# Patient Record
Sex: Female | Born: 1945
Health system: Southern US, Community
[De-identification: ages and names within clinical notes are randomized; demographics above are authoritative.]

## PROBLEM LIST (undated history)

## (undated) DIAGNOSIS — Z8489 Family history of other specified conditions: Secondary | ICD-10-CM

## (undated) DIAGNOSIS — R921 Mammographic calcification found on diagnostic imaging of breast: Secondary | ICD-10-CM

## (undated) DIAGNOSIS — M1990 Unspecified osteoarthritis, unspecified site: Secondary | ICD-10-CM

## (undated) DIAGNOSIS — R897 Abnormal histological findings in specimens from other organs, systems and tissues: Secondary | ICD-10-CM

## (undated) DIAGNOSIS — Z9889 Other specified postprocedural states: Secondary | ICD-10-CM

## (undated) DIAGNOSIS — H269 Unspecified cataract: Secondary | ICD-10-CM

## (undated) DIAGNOSIS — I10 Essential (primary) hypertension: Secondary | ICD-10-CM

## (undated) HISTORY — PX: CATARACT EXTRACTION: SUR2

## (undated) HISTORY — DX: Unspecified cataract: H26.9

## (undated) HISTORY — DX: Abnormal histological findings in specimens from other organs, systems and tissues: R89.7

## (undated) HISTORY — PX: BREAST CYST ASPIRATION: SHX578

## (undated) HISTORY — DX: Essential (primary) hypertension: I10

## (undated) HISTORY — DX: Unspecified osteoarthritis, unspecified site: M19.90

## (undated) HISTORY — PX: BREAST BIOPSY: SHX20

---

## 2006-10-07 DIAGNOSIS — Z9889 Other specified postprocedural states: Secondary | ICD-10-CM

## 2006-10-07 HISTORY — PX: COLONOSCOPY: SHX174

## 2006-10-07 HISTORY — DX: Other specified postprocedural states: Z98.890

## 2006-10-07 LAB — HM COLONOSCOPY

## 2012-10-07 HISTORY — PX: BREAST BIOPSY: SHX20

## 2013-10-04 DIAGNOSIS — R897 Abnormal histological findings in specimens from other organs, systems and tissues: Secondary | ICD-10-CM

## 2013-10-04 HISTORY — DX: Abnormal histological findings in specimens from other organs, systems and tissues: R89.7

## 2014-10-07 DIAGNOSIS — Z8489 Family history of other specified conditions: Secondary | ICD-10-CM

## 2014-10-07 HISTORY — DX: Family history of other specified conditions: Z84.89

## 2015-10-08 LAB — COLOGUARD: COLOGUARD: NEGATIVE

## 2015-11-28 DIAGNOSIS — R5381 Other malaise: Secondary | ICD-10-CM | POA: Diagnosis not present

## 2015-11-28 DIAGNOSIS — I10 Essential (primary) hypertension: Secondary | ICD-10-CM | POA: Diagnosis not present

## 2015-11-28 DIAGNOSIS — R7301 Impaired fasting glucose: Secondary | ICD-10-CM | POA: Diagnosis not present

## 2015-12-13 DIAGNOSIS — H40013 Open angle with borderline findings, low risk, bilateral: Secondary | ICD-10-CM | POA: Diagnosis not present

## 2015-12-18 DIAGNOSIS — H40013 Open angle with borderline findings, low risk, bilateral: Secondary | ICD-10-CM | POA: Diagnosis not present

## 2016-01-01 DIAGNOSIS — R7301 Impaired fasting glucose: Secondary | ICD-10-CM | POA: Diagnosis not present

## 2016-01-01 DIAGNOSIS — E7800 Pure hypercholesterolemia, unspecified: Secondary | ICD-10-CM | POA: Diagnosis not present

## 2016-01-01 DIAGNOSIS — E349 Endocrine disorder, unspecified: Secondary | ICD-10-CM | POA: Diagnosis not present

## 2016-01-01 DIAGNOSIS — I10 Essential (primary) hypertension: Secondary | ICD-10-CM | POA: Diagnosis not present

## 2016-01-03 DIAGNOSIS — H353131 Nonexudative age-related macular degeneration, bilateral, early dry stage: Secondary | ICD-10-CM | POA: Diagnosis not present

## 2016-01-03 DIAGNOSIS — H2513 Age-related nuclear cataract, bilateral: Secondary | ICD-10-CM | POA: Diagnosis not present

## 2016-01-03 DIAGNOSIS — H35372 Puckering of macula, left eye: Secondary | ICD-10-CM | POA: Diagnosis not present

## 2016-01-03 DIAGNOSIS — H40013 Open angle with borderline findings, low risk, bilateral: Secondary | ICD-10-CM | POA: Diagnosis not present

## 2016-01-19 DIAGNOSIS — H2511 Age-related nuclear cataract, right eye: Secondary | ICD-10-CM | POA: Diagnosis not present

## 2016-01-29 DIAGNOSIS — H2512 Age-related nuclear cataract, left eye: Secondary | ICD-10-CM | POA: Diagnosis not present

## 2016-02-19 DIAGNOSIS — H2512 Age-related nuclear cataract, left eye: Secondary | ICD-10-CM | POA: Diagnosis not present

## 2016-02-19 DIAGNOSIS — Z01818 Encounter for other preprocedural examination: Secondary | ICD-10-CM | POA: Diagnosis not present

## 2016-04-12 DIAGNOSIS — Z1231 Encounter for screening mammogram for malignant neoplasm of breast: Secondary | ICD-10-CM | POA: Diagnosis not present

## 2016-04-12 LAB — HM MAMMOGRAPHY

## 2016-04-15 DIAGNOSIS — Z961 Presence of intraocular lens: Secondary | ICD-10-CM | POA: Diagnosis not present

## 2016-05-30 DIAGNOSIS — E7800 Pure hypercholesterolemia, unspecified: Secondary | ICD-10-CM | POA: Diagnosis not present

## 2016-05-30 DIAGNOSIS — R7301 Impaired fasting glucose: Secondary | ICD-10-CM | POA: Diagnosis not present

## 2016-05-30 DIAGNOSIS — Z1159 Encounter for screening for other viral diseases: Secondary | ICD-10-CM | POA: Diagnosis not present

## 2016-06-13 DIAGNOSIS — Z0000 Encounter for general adult medical examination without abnormal findings: Secondary | ICD-10-CM | POA: Diagnosis not present

## 2016-06-13 DIAGNOSIS — I10 Essential (primary) hypertension: Secondary | ICD-10-CM | POA: Diagnosis not present

## 2016-06-13 DIAGNOSIS — R7303 Prediabetes: Secondary | ICD-10-CM | POA: Diagnosis not present

## 2016-06-13 DIAGNOSIS — E7800 Pure hypercholesterolemia, unspecified: Secondary | ICD-10-CM | POA: Diagnosis not present

## 2016-07-10 DIAGNOSIS — Z1212 Encounter for screening for malignant neoplasm of rectum: Secondary | ICD-10-CM | POA: Diagnosis not present

## 2016-07-10 DIAGNOSIS — Z1211 Encounter for screening for malignant neoplasm of colon: Secondary | ICD-10-CM | POA: Diagnosis not present

## 2016-08-26 DIAGNOSIS — H40013 Open angle with borderline findings, low risk, bilateral: Secondary | ICD-10-CM | POA: Diagnosis not present

## 2016-09-16 DIAGNOSIS — H26493 Other secondary cataract, bilateral: Secondary | ICD-10-CM | POA: Diagnosis not present

## 2016-09-16 DIAGNOSIS — H40023 Open angle with borderline findings, high risk, bilateral: Secondary | ICD-10-CM | POA: Diagnosis not present

## 2016-09-16 DIAGNOSIS — Z961 Presence of intraocular lens: Secondary | ICD-10-CM | POA: Diagnosis not present

## 2016-10-15 DIAGNOSIS — H26492 Other secondary cataract, left eye: Secondary | ICD-10-CM | POA: Diagnosis not present

## 2016-11-05 DIAGNOSIS — Z961 Presence of intraocular lens: Secondary | ICD-10-CM | POA: Diagnosis not present

## 2016-11-27 DIAGNOSIS — D481 Neoplasm of uncertain behavior of connective and other soft tissue: Secondary | ICD-10-CM | POA: Diagnosis not present

## 2016-11-27 DIAGNOSIS — R52 Pain, unspecified: Secondary | ICD-10-CM | POA: Diagnosis not present

## 2016-12-02 DIAGNOSIS — E7800 Pure hypercholesterolemia, unspecified: Secondary | ICD-10-CM | POA: Diagnosis not present

## 2016-12-02 DIAGNOSIS — R5381 Other malaise: Secondary | ICD-10-CM | POA: Diagnosis not present

## 2016-12-02 DIAGNOSIS — R7303 Prediabetes: Secondary | ICD-10-CM | POA: Diagnosis not present

## 2016-12-02 DIAGNOSIS — I10 Essential (primary) hypertension: Secondary | ICD-10-CM | POA: Diagnosis not present

## 2016-12-05 HISTORY — PX: EYE SURGERY: SHX253

## 2016-12-12 DIAGNOSIS — E7800 Pure hypercholesterolemia, unspecified: Secondary | ICD-10-CM | POA: Diagnosis not present

## 2016-12-12 DIAGNOSIS — I10 Essential (primary) hypertension: Secondary | ICD-10-CM | POA: Diagnosis not present

## 2016-12-12 DIAGNOSIS — R7303 Prediabetes: Secondary | ICD-10-CM | POA: Diagnosis not present

## 2017-03-05 DIAGNOSIS — H40023 Open angle with borderline findings, high risk, bilateral: Secondary | ICD-10-CM | POA: Diagnosis not present

## 2017-03-11 DIAGNOSIS — H40023 Open angle with borderline findings, high risk, bilateral: Secondary | ICD-10-CM | POA: Diagnosis not present

## 2017-05-27 ENCOUNTER — Encounter (INDEPENDENT_AMBULATORY_CARE_PROVIDER_SITE_OTHER): Payer: Self-pay

## 2017-05-27 ENCOUNTER — Encounter: Payer: Self-pay | Admitting: Primary Care

## 2017-05-27 ENCOUNTER — Ambulatory Visit: Payer: Self-pay | Admitting: Primary Care

## 2017-05-27 ENCOUNTER — Ambulatory Visit (INDEPENDENT_AMBULATORY_CARE_PROVIDER_SITE_OTHER): Payer: Medicare Other | Admitting: Primary Care

## 2017-05-27 VITALS — BP 136/86 | HR 78 | Temp 97.7°F | Ht 63.25 in | Wt 171.1 lb

## 2017-05-27 DIAGNOSIS — I10 Essential (primary) hypertension: Secondary | ICD-10-CM | POA: Diagnosis not present

## 2017-05-27 DIAGNOSIS — Z1239 Encounter for other screening for malignant neoplasm of breast: Secondary | ICD-10-CM

## 2017-05-27 DIAGNOSIS — R2231 Localized swelling, mass and lump, right upper limb: Secondary | ICD-10-CM | POA: Insufficient documentation

## 2017-05-27 DIAGNOSIS — Z1231 Encounter for screening mammogram for malignant neoplasm of breast: Secondary | ICD-10-CM | POA: Diagnosis not present

## 2017-05-27 DIAGNOSIS — H409 Unspecified glaucoma: Secondary | ICD-10-CM | POA: Diagnosis not present

## 2017-05-27 HISTORY — DX: Localized swelling, mass and lump, right upper limb: R22.31

## 2017-05-27 NOTE — Progress Notes (Signed)
   Subjective:    Patient ID: Kim Holloway, female    DOB: [birth year], 71 y.o.   MRN: [id number]  HPI  Kim Holloway is a 71 year old female who presents today to establish care and discuss the problems mentioned below. Will obtain old records. She is due for her mammogram. Her last CPE was in September 2017.  1) Essential Hypertension: Currently managed on losartan-HCTZ 100/12.5 mg. She denies chest pain, shortness of breath. Some dizziness with abrupt positional changes.   BP Readings from Last 3 Encounters:  05/27/17 136/86     2) Finger Mass: Located right second digit, medial to DIP joint that has been present for the past 2-3 months. Over the past month she's noticed more discomfort and decreased sensation. She had a benign cyst removed to the same finger which was in the middle of the DIP joint to anterior side. This was removed in February 2018. She is requesting evaluation for removal.  3) Glaucoma: Left side. Suspicion for glaucoma that was originally diagnosed 3-4 years ago. Just moved from [place] and is needing ophthalmologist.   Review of Systems  Respiratory: Negative for shortness of breath.   Cardiovascular: Negative for chest pain.  Skin: Negative for color change and wound.       Finger mass  Neurological: Negative for dizziness and headaches.       Past Medical History:  Diagnosis Date  . Abnormal breast biopsy 10/04/2013   right breast  . Hypertension      Social History   Social History  . Marital status: Married    Spouse name: N/A  . Number of children: N/A  . Years of education: N/A   Occupational History  . Not on file.   Social History Main Topics  . Smoking status: Never Smoker  . Smokeless tobacco: Never Used  . Alcohol use No  . Drug use: Unknown  . Sexual activity: Not on file   Other Topics Concern  . Not on file   Social History Narrative   Married.   Moved from [place]. Originally from [place].   Retired.          Past Surgical  History:  Procedure Laterality Date  . CATARACT EXTRACTION  01/19/2016, 02/19/2016    No family history on file.  No Known Allergies  No current outpatient prescriptions on file prior to visit.   No current facility-administered medications on file prior to visit.     BP 136/86   Pulse 78   Temp 97.7 F (36.5 C) (Oral)   Ht 5' 3.25" (1.607 m)   Wt 171 lb 1.9 oz (77.6 kg)   SpO2 99%   BMI 30.07 kg/m    Objective:   Physical Exam  Constitutional: She appears well-nourished.  Neck: Neck supple.  Cardiovascular: Normal rate and regular rhythm.   Pulmonary/Chest: Effort normal and breath sounds normal.  Skin: Skin is warm and dry.  0.5 cm circular, flesh colored, firm, immobile mass to right second digit lateral to DIP joint. Non tender.  Psychiatric: She has a normal mood and affect.          Assessment & Plan:

## 2017-05-27 NOTE — Assessment & Plan Note (Signed)
Diagnosed 2-3 years ago. Referral placed to opthalmology for establishment and mangement.

## 2017-05-27 NOTE — Patient Instructions (Signed)
You will be contacted regarding your referral to the hand surgeon and also to the ophthalmologist.  Please let us know if you have not heard back within one week.   Call the [facility] to schedule your mammogram.   Please schedule a physical with me in the next 1-2 months. You may also schedule a lab only appointment 3-4 days prior. We will discuss your lab results in detail during your physical.  It was a pleasure to meet you today! Please don't hesitate to call me with any questions. Welcome to [facility]!

## 2017-05-27 NOTE — Assessment & Plan Note (Signed)
History of benign cyst removed in February 2018, looks like this has returned. Exam today represents benign cyst. She would like to have this surgically removed given site and discomfort. Referral placed for consultation.

## 2017-05-27 NOTE — Assessment & Plan Note (Signed)
Stable on losartan-HCTZ, continue same.

## 2017-06-03 ENCOUNTER — Ambulatory Visit
Admission: RE | Admit: 2017-06-03 | Discharge: 2017-06-03 | Disposition: A | Discharge status: Home / Self Care | Payer: Medicare Other | Source: Ambulatory Visit | Attending: Primary Care | Admitting: Primary Care

## 2017-06-03 DIAGNOSIS — Z1239 Encounter for other screening for malignant neoplasm of breast: Secondary | ICD-10-CM

## 2017-06-03 DIAGNOSIS — Z1231 Encounter for screening mammogram for malignant neoplasm of breast: Secondary | ICD-10-CM | POA: Insufficient documentation

## 2017-06-04 ENCOUNTER — Encounter: Payer: Self-pay | Admitting: Primary Care

## 2017-06-07 DIAGNOSIS — R921 Mammographic calcification found on diagnostic imaging of breast: Secondary | ICD-10-CM

## 2017-06-07 HISTORY — DX: Mammographic calcification found on diagnostic imaging of breast: R92.1

## 2017-06-18 ENCOUNTER — Encounter: Payer: Self-pay | Admitting: Primary Care

## 2017-06-19 ENCOUNTER — Inpatient Hospital Stay
Admission: RE | Admit: 2017-06-19 | Discharge: 2017-06-19 | Disposition: A | Discharge status: Home / Self Care | Payer: Self-pay | Source: Ambulatory Visit | Attending: *Deleted | Admitting: *Deleted

## 2017-06-19 ENCOUNTER — Other Ambulatory Visit: Payer: Self-pay | Admitting: *Deleted

## 2017-06-19 DIAGNOSIS — Z9289 Personal history of other medical treatment: Secondary | ICD-10-CM

## 2017-06-20 ENCOUNTER — Ambulatory Visit: Payer: Medicare Other

## 2017-06-20 ENCOUNTER — Other Ambulatory Visit: Payer: Self-pay | Admitting: Primary Care

## 2017-06-20 DIAGNOSIS — R928 Other abnormal and inconclusive findings on diagnostic imaging of breast: Secondary | ICD-10-CM

## 2017-06-23 ENCOUNTER — Ambulatory Visit (INDEPENDENT_AMBULATORY_CARE_PROVIDER_SITE_OTHER): Payer: Medicare Other

## 2017-06-23 ENCOUNTER — Other Ambulatory Visit: Payer: Self-pay

## 2017-06-23 VITALS — BP 130/80 | HR 75 | Temp 98.0°F | Ht 64.5 in | Wt 171.0 lb

## 2017-06-23 DIAGNOSIS — Z0000 Encounter for general adult medical examination without abnormal findings: Secondary | ICD-10-CM | POA: Diagnosis not present

## 2017-06-23 DIAGNOSIS — Z1159 Encounter for screening for other viral diseases: Secondary | ICD-10-CM | POA: Diagnosis not present

## 2017-06-23 DIAGNOSIS — I10 Essential (primary) hypertension: Secondary | ICD-10-CM

## 2017-06-23 MED ORDER — LOSARTAN POTASSIUM-HCTZ 100-12.5 MG PO TABS: 1.0000 | ORAL_TABLET | Freq: Every day | ORAL | 0 refills | Status: DC

## 2017-06-23 NOTE — Progress Notes (Signed)
Pre visit review using our clinic review tool, if applicable. No additional management support is needed unless otherwise documented below in the visit note. 

## 2017-06-23 NOTE — Progress Notes (Signed)
PCP notes:   Health maintenance:  Flu vaccine - patient declined Hep C screening - completed  Abnormal screenings:   Hearing - failed  Hearing Screening   125Hz  250Hz  500Hz  1000Hz  2000Hz  3000Hz  4000Hz  6000Hz  8000Hz   Right ear:   0 0 40  0    Left ear:   40 40 40  40     Depression score: 1  Patient concerns:   None  Nurse concerns:  None  Next PCP appt:   06/25/17 @ 1145

## 2017-06-23 NOTE — Progress Notes (Signed)
Subjective:   Kim Holloway is a 71 y.o. female who presents for Medicare Annual preventive examination.  Review of Systems:  N/A Cardiac Risk Factors include: advanced age (>49men, >69 women);hypertension     Objective:     Vitals: BP 130/80 (BP Location: Right Arm, Patient Position: Sitting, Cuff Size: Normal)   Pulse 75   Temp 98 F (36.7 C) (Oral)   Ht 5' 4.5" (1.638 m) Comment: no shoes  Wt 171 lb (77.6 kg)   SpO2 95%   BMI 28.90 kg/m   Body mass index is 28.9 kg/m.   Tobacco History  Smoking Status  . Never Smoker  Smokeless Tobacco  . Never Used     Counseling given: No   Past Medical History:  Diagnosis Date  . Abnormal breast biopsy 10/04/2013   right breast  . Hypertension    Past Surgical History:  Procedure Laterality Date  . BREAST BIOPSY Bilateral 2014   stereo bx neg   . CATARACT EXTRACTION  01/19/2016, 02/19/2016  . EYE SURGERY Left 12/2016   laser surgery   History reviewed. No pertinent family history. History  Sexual Activity  . Sexual activity: Not on file    Outpatient Encounter Prescriptions as of 06/23/2017  Medication Sig  . aspirin EC 81 MG tablet Take 81 mg by mouth daily.  Marland Kitchen CALCIUM PO Take 2 tablets by mouth daily.  . cholecalciferol (VITAMIN D) 1000 units tablet Take 1,000 Units by mouth daily.  Marland Kitchen losartan-hydrochlorothiazide (HYZAAR) 100-12.5 MG tablet Take 1 tablet by mouth daily.    No facility-administered encounter medications on file as of 06/23/2017.     Activities of Daily Living In your present state of health, do you have any difficulty performing the following activities: 06/23/2017  Hearing? N  Vision? N  Difficulty concentrating or making decisions? N  Walking or climbing stairs? N  Dressing or bathing? N  Doing errands, shopping? N  Preparing Food and eating ? N  Using the Toilet? N  In the past six months, have you accidently leaked urine? N  Do you have problems with loss of bowel control? N    Managing your Medications? N  Managing your Finances? N  Housekeeping or managing your Housekeeping? N    Patient Care Team: Pleas Koch, NP as PCP - General (Internal Medicine)    Assessment:     Hearing Screening   125Hz  250Hz  500Hz  1000Hz  2000Hz  3000Hz  4000Hz  6000Hz  8000Hz   Right ear:   0 0 40  0    Left ear:   40 40 40  40    Vision Screening Comments: Last vision exam in March 2018   Exercise Activities and Dietary recommendations Current Exercise Habits: Home exercise routine, Type of exercise: walking, Time (Minutes): 60, Frequency (Times/Week): 7, Weekly Exercise (Minutes/Week): 420, Intensity: Mild, Exercise limited by: None identified  Goals    . Increase physical activity          Starting 06/23/2017 and as weather permits, I will continue to walk at least 60 min dialy      Fall Risk Fall Risk  06/23/2017  Falls in the past year? No   Depression Screen PHQ 2/9 Scores 06/23/2017  PHQ - 2 Score 0  PHQ- 9 Score 1     Cognitive Function MMSE - Mini Mental State Exam 06/23/2017  Orientation to time 5  Orientation to Place 5  Registration 3  Attention/ Calculation 0  Recall 3  Language- name 2 objects 0  Language- repeat 1  Language- follow 3 step command 3  Language- read & follow direction 0  Write a sentence 0  Copy design 0  Total score 20       PLEASE NOTE: A Mini-Cog screen was completed. Maximum score is 20. A value of 0 denotes this part of Folstein MMSE was not completed or the patient failed this part of the Mini-Cog screening.   Mini-Cog Screening Orientation to Time - Max 5 pts Orientation to Place - Max 5 pts Registration - Max 3 pts Recall - Max 3 pts Language Repeat - Max 1 pts Language Follow 3 Step Command - Max 3 pts   Immunization History  Administered Date(s) Administered  . Pneumococcal Conjugate-13 11/30/2014  . Pneumococcal Polysaccharide-23 07/07/2013  . Tdap 07/07/2013  . Zoster 01/01/2016   Screening  Tests Health Maintenance  Topic Date Due  . INFLUENZA VACCINE  01/04/2018 (Originally 05/07/2017)  . MAMMOGRAM  06/04/2019  . Fecal DNA (Cologuard)  07/08/2019  . TETANUS/TDAP  07/08/2023  . DEXA SCAN  Completed  . Hepatitis C Screening  Completed  . PNA vac Low Risk Adult  Completed      Plan:     I have personally reviewed and addressed the Medicare Annual Wellness questionnaire and have noted the following in the patient's chart:  A. Medical and social history B. Use of alcohol, tobacco or illicit drugs  C. Current medications and supplements D. Functional ability and status E.  Nutritional status F.  Physical activity G. Advance directives H. List of other physicians I.  Hospitalizations, surgeries, and ER visits in previous 12 months J.  [place] to include hearing, vision, cognitive, depression L. Referrals and appointments - none  In addition, I have reviewed and discussed with patient certain preventive protocols, quality metrics, and best practice recommendations. A written personalized care plan for preventive services as well as general preventive health recommendations were provided to patient.  See attached scanned questionnaire for additional information.   Signed,   Lindell Noe, MHA, BS, LPN Health Coach

## 2017-06-23 NOTE — Patient Instructions (Signed)
Ms. Toothman , Thank you for taking time to come for your Medicare Wellness Visit. I appreciate your ongoing commitment to your health goals. Please review the following plan we discussed and let me know if I can assist you in the future.   These are the goals we discussed: Goals    . Increase physical activity          Starting 06/23/2017 and as weather permits, I will continue to walk at least 60 min dialy       This is a list of the screening recommended for you and due dates:  Health Maintenance  Topic Date Due  . Flu Shot  01/04/2018*  . Mammogram  06/04/2019  . Cologuard (Stool DNA test)  07/08/2019  . Tetanus Vaccine  07/08/2023  . DEXA scan (bone density measurement)  Completed  .  Hepatitis C: One time screening is recommended by Center for Disease Control  (CDC) for  adults born from [birth year] through 1965.   Completed  . Pneumonia vaccines  Completed  *Topic was postponed. The date shown is not the original due date.   Preventive Care for Adults  A healthy lifestyle and preventive care can promote health and wellness. Preventive health guidelines for adults include the following key practices.  . A routine yearly physical is a good way to check with your health care provider about your health and preventive screening. It is a chance to share any concerns and updates on your health and to receive a thorough exam.  . Visit your dentist for a routine exam and preventive care every 6 months. Brush your teeth twice a day and floss once a day. Good oral hygiene prevents tooth decay and gum disease.  . The frequency of eye exams is based on your age, health, family medical history, use  of contact lenses, and other factors. Follow your health care provider's ecommendations for frequency of eye exams.  . Eat a healthy diet. Foods like vegetables, fruits, whole grains, low-fat dairy products, and lean protein foods contain the nutrients you need without too many calories. Decrease your  intake of foods high in solid fats, added sugars, and salt. Eat the right amount of calories for you. Get information about a proper diet from your health care provider, if necessary.  . Regular physical exercise is one of the most important things you can do for your health. Most adults should get at least 150 minutes of moderate-intensity exercise (any activity that increases your heart rate and causes you to sweat) each week. In addition, most adults need muscle-strengthening exercises on 2 or more days a week.  Silver Sneakers may be a benefit available to you. To determine eligibility, you may visit the website: www.silversneakers.com or contact program at 651-650-5449 Mon-Fri between 8AM-8PM.   . Maintain a healthy weight. The body mass index (BMI) is a screening tool to identify possible weight problems. It provides an estimate of body fat based on height and weight. Your health care provider can find your BMI and can help you achieve or maintain a healthy weight.   For adults 20 years and older: ? A BMI below 18.5 is considered underweight. ? A BMI of 18.5 to 24.9 is normal. ? A BMI of 25 to 29.9 is considered overweight. ? A BMI of 30 and above is considered obese.   . Maintain normal blood lipids and cholesterol levels by exercising and minimizing your intake of saturated fat. Eat a balanced diet with plenty of  fruit and vegetables. Blood tests for lipids and cholesterol should begin at age 2 and be repeated every 5 years. If your lipid or cholesterol levels are high, you are over 50, or you are at high risk for heart disease, you may need your cholesterol levels checked more frequently. Ongoing high lipid and cholesterol levels should be treated with medicines if diet and exercise are not working.  . If you smoke, find out from your health care provider how to quit. If you do not use tobacco, please do not start.  . If you choose to drink alcohol, please do not consume more than 2  drinks per day. One drink is considered to be 12 ounces (355 mL) of beer, 5 ounces (148 mL) of wine, or 1.5 ounces (44 mL) of liquor.  . If you are 61-66 years old, ask your health care provider if you should take aspirin to prevent strokes.  . Use sunscreen. Apply sunscreen liberally and repeatedly throughout the day. You should seek shade when your shadow is shorter than you. Protect yourself by wearing long sleeves, pants, a wide-brimmed hat, and sunglasses year round, whenever you are outdoors.  . Once a month, do a whole body skin exam, using a mirror to look at the skin on your back. Tell your health care provider of new moles, moles that have irregular borders, moles that are larger than a pencil eraser, or moles that have changed in shape or color.

## 2017-06-23 NOTE — Progress Notes (Signed)
I reviewed health advisor's note, was available for consultation, and agree with documentation and plan.  

## 2017-06-23 NOTE — Telephone Encounter (Signed)
Noted.  Refill sent to pharmacy. 

## 2017-06-23 NOTE — Telephone Encounter (Signed)
During encounter at [facility], patient requested refill of Losartan-HCTZ 100-12.5 mg. Pharmacy confirmed and is correct as listed.

## 2017-06-24 LAB — HEPATITIS C ANTIBODY
HEP C AB: NONREACTIVE
SIGNAL TO CUT-OFF: 0.01 (ref <1.00)

## 2017-06-25 ENCOUNTER — Encounter: Payer: Self-pay | Admitting: Primary Care

## 2017-06-25 ENCOUNTER — Ambulatory Visit (INDEPENDENT_AMBULATORY_CARE_PROVIDER_SITE_OTHER): Payer: Medicare Other | Admitting: Primary Care

## 2017-06-25 VITALS — BP 136/84 | HR 78 | Temp 97.7°F | Ht 64.5 in | Wt 171.0 lb

## 2017-06-25 DIAGNOSIS — E2839 Other primary ovarian failure: Secondary | ICD-10-CM

## 2017-06-25 DIAGNOSIS — I10 Essential (primary) hypertension: Secondary | ICD-10-CM | POA: Diagnosis not present

## 2017-06-25 DIAGNOSIS — H409 Unspecified glaucoma: Secondary | ICD-10-CM | POA: Diagnosis not present

## 2017-06-25 NOTE — Assessment & Plan Note (Signed)
Stable in the office today, continue losartan-HCTZ 100-12.5 mg. BMP unremarkable and up to date.

## 2017-06-25 NOTE — Assessment & Plan Note (Signed)
Scheduled to see ophthalmologist in December.

## 2017-06-25 NOTE — Patient Instructions (Signed)
Complete your mammogram and bone density testing as scheduled.  Follow up with opthalmology as scheduled.  Follow up with me in 1 year or sooner if needed.  It was a pleasure to see you today!

## 2017-06-25 NOTE — Progress Notes (Signed)
Subjective:    Patient ID: Kim Holloway, female    DOB: 11/28/1945, 71 y.o.   MRN: [id number]  HPI  Kim Holloway is a 71 year old female who presents today for [place] Part 2. She saw our health advisor last week.  Doing well, has no complaints today. Scheduled to see ophthalmologist in December 2018.   BP Readings from Last 3 Encounters:  06/25/17 136/84  06/23/17 130/80  05/27/17 136/86      Immunizations: -Tetanus: Completed in 2014 -Influenza: Declines -Pneumonia: Completed Pneumovax 23 in 2014, and Prevnar 13 in 2016 -Shingles: Insurance would not cover.   Colonoscopy: Completed in 2008, completed cologuard in 2017 - negative Dexa: Completed in 2014, due. Pap Smear: Completed years ago, declines.  Mammogram: Scheduled for 06/30/17   Review of Systems  Constitutional: Negative for unexpected weight change.  HENT: Negative for rhinorrhea.   Respiratory: Negative for cough and shortness of breath.   Cardiovascular: Negative for chest pain.  Gastrointestinal: Negative for constipation and diarrhea.  Genitourinary: Negative for difficulty urinating and menstrual problem.  Musculoskeletal: Negative for arthralgias and myalgias.  Skin: Negative for rash.  Allergic/Immunologic: Negative for environmental allergies.  Neurological: Negative for dizziness, numbness and headaches.  Psychiatric/Behavioral:       Denies concerns for anxiety or depression       Past Medical History:  Diagnosis Date  . Abnormal breast biopsy 10/04/2013   right breast  . Hypertension      Social History   Social History  . Marital status: Married    Spouse name: N/A  . Number of children: N/A  . Years of education: N/A   Occupational History  . Not on file.   Social History Main Topics  . Smoking status: Never Smoker  . Smokeless tobacco: Never Used  . Alcohol use No  . Drug use: No  . Sexual activity: Not on file   Other Topics Concern  . Not on file   Social History Narrative    Married.   Moved from [place]. Originally from [place].   Retired.          Past Surgical History:  Procedure Laterality Date  . BREAST BIOPSY Bilateral 2014   stereo bx neg   . CATARACT EXTRACTION  01/19/2016, 02/19/2016  . EYE SURGERY Left 12/2016   laser surgery    No family history on file.  No Known Allergies  Current Outpatient Prescriptions on File Prior to Visit  Medication Sig Dispense Refill  . aspirin EC 81 MG tablet Take 81 mg by mouth daily.    Marland Kitchen CALCIUM PO Take 2 tablets by mouth daily.    . cholecalciferol (VITAMIN D) 1000 units tablet Take 1,000 Units by mouth daily.    Marland Kitchen losartan-hydrochlorothiazide (HYZAAR) 100-12.5 MG tablet Take 1 tablet by mouth daily. 90 tablet 0   No current facility-administered medications on file prior to visit.     BP 136/84   Pulse 78   Temp 97.7 F (36.5 C) (Oral)   Ht 5' 4.5" (1.638 m)   Wt 171 lb (77.6 kg)   SpO2 95%   BMI 28.90 kg/m    Objective:   Physical Exam  Constitutional: She is oriented to person, place, and time. She appears well-nourished.  HENT:  Right Ear: Tympanic membrane and ear canal normal.  Left Ear: Tympanic membrane and ear canal normal.  Nose: Nose normal.  Mouth/Throat: Oropharynx is clear and moist.  Eyes: Pupils are equal, round, and reactive to light.  Conjunctivae and EOM are normal.  Neck: Neck supple. No thyromegaly present.  Cardiovascular: Normal rate and regular rhythm.   No murmur heard. Pulmonary/Chest: Effort normal and breath sounds normal. She has no rales.  Abdominal: Soft. Bowel sounds are normal. There is no tenderness.  Musculoskeletal: Normal range of motion.  Lymphadenopathy:    She has no cervical adenopathy.  Neurological: She is alert and oriented to person, place, and time. She has normal reflexes. No cranial nerve deficit.  Skin: Skin is warm and dry. No rash noted.  Psychiatric: She has a normal mood and affect.          Assessment & Plan:  Health  Maintenance:  Mammogram pending. Bone density due, ordered today. Declines Pap. Colon cancer screening UTD. Immunizations UTD, declines influenza. Exam unremarkable. Labs unremarkable. Follow up in 1 year.  Sheral Flow, NP

## 2017-06-30 ENCOUNTER — Ambulatory Visit
Admission: RE | Admit: 2017-06-30 | Discharge: 2017-06-30 | Disposition: A | Discharge status: Home / Self Care | Payer: Medicare Other | Source: Ambulatory Visit | Attending: Primary Care | Admitting: Primary Care

## 2017-06-30 DIAGNOSIS — R928 Other abnormal and inconclusive findings on diagnostic imaging of breast: Secondary | ICD-10-CM

## 2017-06-30 DIAGNOSIS — N6001 Solitary cyst of right breast: Secondary | ICD-10-CM | POA: Diagnosis not present

## 2017-06-30 DIAGNOSIS — N6489 Other specified disorders of breast: Secondary | ICD-10-CM | POA: Diagnosis not present

## 2017-07-02 DIAGNOSIS — M25841 Other specified joint disorders, right hand: Secondary | ICD-10-CM | POA: Diagnosis not present

## 2017-07-14 DIAGNOSIS — M25841 Other specified joint disorders, right hand: Secondary | ICD-10-CM | POA: Diagnosis not present

## 2017-07-21 ENCOUNTER — Encounter
Admission: RE | Admit: 2017-07-21 | Discharge: 2017-07-21 | Disposition: A | Discharge status: Home / Self Care | Payer: Medicare Other | Source: Ambulatory Visit | Attending: Orthopedic Surgery | Admitting: Orthopedic Surgery

## 2017-07-21 DIAGNOSIS — Z01810 Encounter for preprocedural cardiovascular examination: Secondary | ICD-10-CM | POA: Insufficient documentation

## 2017-07-21 DIAGNOSIS — Z01812 Encounter for preprocedural laboratory examination: Secondary | ICD-10-CM | POA: Insufficient documentation

## 2017-07-21 DIAGNOSIS — I10 Essential (primary) hypertension: Secondary | ICD-10-CM | POA: Insufficient documentation

## 2017-07-21 HISTORY — DX: Mammographic calcification found on diagnostic imaging of breast: R92.1

## 2017-07-21 HISTORY — DX: Other specified postprocedural states: Z98.890

## 2017-07-21 HISTORY — DX: Family history of other specified conditions: Z84.89

## 2017-07-21 LAB — CBC
HEMATOCRIT: 41 % (ref 35.0–47.0)
Hemoglobin: 14 g/dL (ref 12.0–16.0)
MCH: 31.7 pg (ref 26.0–34.0)
MCHC: 34.2 g/dL (ref 32.0–36.0)
MCV: 92.7 fL (ref 80.0–100.0)
Platelets: 248 10*3/uL (ref 150–440)
RBC: 4.42 MIL/uL (ref 3.80–5.20)
RDW: 12.5 % (ref 11.5–14.5)
WBC: 9.9 10*3/uL (ref 3.6–11.0)

## 2017-07-21 LAB — BASIC METABOLIC PANEL
ANION GAP: 11 (ref 5–15)
BUN: 10 mg/dL (ref 6–20)
CALCIUM: 9.3 mg/dL (ref 8.9–10.3)
CO2: 29 mmol/L (ref 22–32)
CREATININE: 0.59 mg/dL (ref 0.44–1.00)
Chloride: 100 mmol/L — ABNORMAL LOW (ref 101–111)
Glucose, Bld: 104 mg/dL — ABNORMAL HIGH (ref 65–99)
Potassium: 3.5 mmol/L (ref 3.5–5.1)
SODIUM: 140 mmol/L (ref 135–145)

## 2017-07-21 NOTE — Patient Instructions (Addendum)
Your procedure is scheduled NO:[id number] 07/31/17 Report to Same Day Surgery on the 2nd floor in the [facility]. To find out your arrival time, please call [phone] between 1PM - 3PM on: Wed. 07/30/17  REMEMBER: Instructions that are not followed completely may result in serious medical risk up to and including death; or upon the discretion of your surgeon and anesthesiologist your surgery may need to be rescheduled.  Do not eat food or drink liquids after midnight. No gum chewing or hard candies.  You may however, drink CLEAR liquids up to 2 hours before you are scheduled to arrive at the hospital for your procedure.  Do not drink clear liquids within 2 hours of your scheduled arrival to the hospital as this may lead to your procedure being delayed or rescheduled.  Clear liquids include: - water  - apple juice without pulp - clear gatorade - black coffee or tea (NO milk, creamers, sugars) DO NOT drink anything not on this list.  Type 1 and Type 2 diabetics should only drink water.  No Alcohol for 24 hours before or after surgery.  No Smoking for 24 hours prior to surgery.  Notify your doctor if there is any change in your medical condition (cold, fever, infection).  Do not wear jewelry, make-up, hairpins, clips or nail polish.  Do not wear lotions, powders, or perfumes.   Do not shave 48 hours prior to surgery. Men may shave face and neck.  Contacts and dentures may not be worn into surgery.  Do not bring valuables to the hospital. [facility] is not responsible for any belongings or valuables.   TAKE THESE MEDICATIONS THE MORNING OF SURGERY WITH A SIP OF WATER:    Use CHG Soap or wipes as directed on instruction sheet.  Fleets enema or Magnesium Citrate as directed.  Use inhalers on the day of surgery and bring to the hospital.  Bring your C-PAP to the hospital with you in case you may have to spend the night.  Stop Metformin and Janumet 2 days prior to  surgery.  Take 1/2 of usual insulin dose the night before surgery and none on the morning of surgery.  Follow recommendations from Cardiologist, Pulmonologist or PCP regarding stopping Aspirin, Coumadin, Plavix, Eliquis, Pradaxa, or Pletal.  Stop Anti-inflammatories such as Advil, Aleve, Ibuprofen, Motrin, Naproxen, Naprosyn, Goodie powder, or aspirin products. (May take Tylenol or Acetaminophen and Celebrex if needed.)  Stop supplements until after surgery. (May continue Vitamin D, Vitamin B, and multivitamin.)  If you are being admitted to the hospital overnight, leave your suitcase in the car. After surgery it may be brought to your room.  If you are being discharged the day of surgery, you will not be allowed to drive home. You will need someone to drive you home and stay with you that night.   If you are taking public transportation, you will need to have a responsible adult to with you.  Please call the number above if you have any questions about these instructions.

## 2017-07-21 NOTE — Pre-Procedure Instructions (Signed)
Your procedure is scheduled on: Thursday Sept. 25,2018 Report to Same Day Surgery  To find out your arrival time please call [phone] between 1PM - 3PM on Wed. 07/30/17  Remember: Instructions that are not followed completely may result in serious medical risk, up to and including death, or upon the discretion of your surgeon and anesthesiologist your surgery may need to be rescheduled.     _X__ 1. Do not eat food after midnight the night before your procedure.                 No gum chewing or hard candies. You may drink clear liquids up to 2 hours                 before you are scheduled to arrive for your surgery- DO not drink clear                 liquids within 2 hours of the start of your surgery.                 Clear Liquids include:  water, apple juice without pulp, clear carbohydrate                 drink such as Clearfast of Gartorade, Black Coffee or Tea (Do not add                 anything to coffee or tea).     ___ 2.  No Alcohol for 24 hours before or after surgery.   ___ 3.  Do Not Smoke or use e-cigarettes For 24 Hours Prior to Your Surgery.                 Do not use any chewable tobacco products for at least 6 hours prior to                 surgery.  ____  4.  Bring all medications with you on the day of surgery if instructed.   ____  5.  Notify your doctor if there is any change in your medical condition      (cold, fever, infections).     Do not wear jewelry, make-up, hairpins, clips or nail polish. Do not wear lotions, powders, or perfumes. You may wear deodorant. Do not shave 48 hours prior to surgery. Men may shave face and neck. Do not bring valuables to the hospital.    [facility] is not responsible for any belongings or valuables.  Contacts, dentures or bridgework may not be worn into surgery. Leave your suitcase in the car. After surgery it may be brought to your room. For patients admitted to the hospital, discharge time is  determined by your treatment team.   Patients discharged the day of surgery will not be allowed to drive home.   Please read over the following fact sheets that you were given:  For Pre-op surgery          ____ Take these medicines the morning of surgery with A SIP OF WATER:    1. losartan-hydrochlorothiazide    .        ____ Fleet Enema (as directed)   ___X_ Use CHG Soap as directed  ____ Use inhalers on the day of surgery  ____ Stop metformin 2 days prior to surgery    ____ Take 1/2 of usual insulin dose the night before surgery. No insulin the morning          of surgery.  __X__ Stop supplements until after surgery.    ____ Bring C-Pap to the hospital.

## 2017-07-22 ENCOUNTER — Telehealth: Payer: Self-pay | Admitting: Primary Care

## 2017-07-22 NOTE — Pre-Procedure Instructions (Signed)
07/22/17 8:15 am Call Dr Ronelle Nigh regarding abnormal EKG.  EKG sent to primary care physician with request to optimize for surgery.  Dr Theodore Demark office faxed with information discussed above.

## 2017-07-22 NOTE — Telephone Encounter (Signed)
Please notify patient that we received a notification from the surgical center at [facility] stating that her ECG was abnormal. I recommend she get clearance from cardiology prior to undergoing anesthesia. If she's agreeable I'll send her to cardiology for clearance.

## 2017-07-23 DIAGNOSIS — I10 Essential (primary) hypertension: Secondary | ICD-10-CM | POA: Diagnosis not present

## 2017-07-23 DIAGNOSIS — M1990 Unspecified osteoarthritis, unspecified site: Secondary | ICD-10-CM | POA: Diagnosis not present

## 2017-07-23 DIAGNOSIS — M25841 Other specified joint disorders, right hand: Secondary | ICD-10-CM | POA: Diagnosis not present

## 2017-07-23 DIAGNOSIS — R9431 Abnormal electrocardiogram [ECG] [EKG]: Secondary | ICD-10-CM | POA: Diagnosis not present

## 2017-07-23 DIAGNOSIS — Z01818 Encounter for other preprocedural examination: Secondary | ICD-10-CM | POA: Diagnosis not present

## 2017-07-24 NOTE — Telephone Encounter (Signed)
Patient had already been referral by the PreAdmit to see cardiology and had her appointment yesterday. She got okay to go through the surgery.

## 2017-07-24 NOTE — Telephone Encounter (Signed)
Noted  

## 2017-07-24 NOTE — Progress Notes (Signed)
Cardiology clearance form received from Dr. Clayborn Bigness; indicates that risk assessment is Low and patient is optimized for surgery.

## 2017-07-31 ENCOUNTER — Ambulatory Visit: Payer: Medicare Other | Admitting: Anesthesiology

## 2017-07-31 ENCOUNTER — Encounter
Admission: RE | Disposition: A | Discharge status: Home / Self Care | Payer: Self-pay | Source: Ambulatory Visit | Attending: Orthopedic Surgery

## 2017-07-31 ENCOUNTER — Ambulatory Visit
Admission: RE | Admit: 2017-07-31 | Discharge: 2017-07-31 | Disposition: A | Discharge status: Home / Self Care | Payer: Medicare Other | Source: Ambulatory Visit | Attending: Orthopedic Surgery | Admitting: Orthopedic Surgery

## 2017-07-31 ENCOUNTER — Encounter: Payer: Self-pay | Admitting: *Deleted

## 2017-07-31 DIAGNOSIS — Z7982 Long term (current) use of aspirin: Secondary | ICD-10-CM | POA: Diagnosis not present

## 2017-07-31 DIAGNOSIS — Z79899 Other long term (current) drug therapy: Secondary | ICD-10-CM | POA: Diagnosis not present

## 2017-07-31 DIAGNOSIS — D3612 Benign neoplasm of peripheral nerves and autonomic nervous system, upper limb, including shoulder: Secondary | ICD-10-CM | POA: Diagnosis not present

## 2017-07-31 DIAGNOSIS — D492 Neoplasm of unspecified behavior of bone, soft tissue, and skin: Secondary | ICD-10-CM | POA: Diagnosis not present

## 2017-07-31 DIAGNOSIS — D2111 Benign neoplasm of connective and other soft tissue of right upper limb, including shoulder: Secondary | ICD-10-CM | POA: Diagnosis not present

## 2017-07-31 DIAGNOSIS — I10 Essential (primary) hypertension: Secondary | ICD-10-CM | POA: Insufficient documentation

## 2017-07-31 DIAGNOSIS — D481 Neoplasm of uncertain behavior of connective and other soft tissue: Secondary | ICD-10-CM | POA: Insufficient documentation

## 2017-07-31 DIAGNOSIS — M25841 Other specified joint disorders, right hand: Secondary | ICD-10-CM | POA: Diagnosis not present

## 2017-07-31 HISTORY — PX: CYST EXCISION: SHX5701

## 2017-07-31 SURGERY — CYST REMOVAL
Anesthesia: General | Laterality: Right

## 2017-07-31 MED ORDER — LIDOCAINE HCL (PF) 2 % IJ SOLN
INTRAMUSCULAR | Status: AC
  Filled 2017-07-31: qty 10

## 2017-07-31 MED ORDER — CEFAZOLIN SODIUM-DEXTROSE 2-4 GM/100ML-% IV SOLN
2.0000 g | Freq: Once | INTRAVENOUS | Status: AC
  Administered 2017-07-31: 2 g via INTRAVENOUS

## 2017-07-31 MED ORDER — BUPIVACAINE HCL (PF) 0.5 % IJ SOLN
INTRAMUSCULAR | Status: DC | PRN
  Administered 2017-07-31: 10 mL

## 2017-07-31 MED ORDER — FENTANYL CITRATE (PF) 100 MCG/2ML IJ SOLN: 25.0000 ug | INTRAMUSCULAR | Status: DC | PRN

## 2017-07-31 MED ORDER — METOCLOPRAMIDE HCL 10 MG PO TABS: 5.0000 mg | ORAL_TABLET | Freq: Three times a day (TID) | ORAL | Status: DC | PRN

## 2017-07-31 MED ORDER — CEFAZOLIN SODIUM-DEXTROSE 2-4 GM/100ML-% IV SOLN
INTRAVENOUS | Status: AC
  Filled 2017-07-31: qty 100

## 2017-07-31 MED ORDER — PROPOFOL 10 MG/ML IV BOLUS
INTRAVENOUS | Status: AC
  Filled 2017-07-31: qty 20

## 2017-07-31 MED ORDER — METOCLOPRAMIDE HCL 5 MG/ML IJ SOLN: 5.0000 mg | Freq: Three times a day (TID) | INTRAMUSCULAR | Status: DC | PRN

## 2017-07-31 MED ORDER — FAMOTIDINE 20 MG PO TABS
ORAL_TABLET | ORAL | Status: AC
  Administered 2017-07-31: 20 mg via ORAL
  Filled 2017-07-31: qty 1

## 2017-07-31 MED ORDER — ONDANSETRON HCL 4 MG/2ML IJ SOLN
INTRAMUSCULAR | Status: AC
  Filled 2017-07-31: qty 2

## 2017-07-31 MED ORDER — ONDANSETRON HCL 4 MG PO TABS: 4.0000 mg | ORAL_TABLET | Freq: Four times a day (QID) | ORAL | Status: DC | PRN

## 2017-07-31 MED ORDER — BUPIVACAINE HCL (PF) 0.5 % IJ SOLN
INTRAMUSCULAR | Status: AC
  Filled 2017-07-31: qty 30

## 2017-07-31 MED ORDER — HYDROCODONE-ACETAMINOPHEN 5-325 MG PO TABS: 1.0000 | ORAL_TABLET | Freq: Four times a day (QID) | ORAL | 0 refills | Status: DC | PRN

## 2017-07-31 MED ORDER — MIDAZOLAM HCL 2 MG/2ML IJ SOLN
INTRAMUSCULAR | Status: AC
  Filled 2017-07-31: qty 2

## 2017-07-31 MED ORDER — LIDOCAINE HCL (PF) 1 % IJ SOLN
INTRAMUSCULAR | Status: AC
  Filled 2017-07-31: qty 30

## 2017-07-31 MED ORDER — FENTANYL CITRATE (PF) 100 MCG/2ML IJ SOLN
INTRAMUSCULAR | Status: DC | PRN
  Administered 2017-07-31: 50 ug via INTRAVENOUS
  Administered 2017-07-31 (×2): 25 ug via INTRAVENOUS

## 2017-07-31 MED ORDER — SEVOFLURANE IN SOLN
RESPIRATORY_TRACT | Status: AC
  Filled 2017-07-31: qty 250

## 2017-07-31 MED ORDER — HYDROCODONE-ACETAMINOPHEN 5-325 MG PO TABS: 1.0000 | ORAL_TABLET | ORAL | Status: DC | PRN

## 2017-07-31 MED ORDER — FENTANYL CITRATE (PF) 100 MCG/2ML IJ SOLN
INTRAMUSCULAR | Status: AC
  Filled 2017-07-31: qty 2

## 2017-07-31 MED ORDER — PROPOFOL 10 MG/ML IV BOLUS
INTRAVENOUS | Status: DC | PRN
  Administered 2017-07-31: 40 mg via INTRAVENOUS
  Administered 2017-07-31: 110 mg via INTRAVENOUS

## 2017-07-31 MED ORDER — LACTATED RINGERS IV SOLN
INTRAVENOUS | Status: DC
  Administered 2017-07-31: 06:00:00 via INTRAVENOUS

## 2017-07-31 MED ORDER — DEXAMETHASONE SODIUM PHOSPHATE 10 MG/ML IJ SOLN
INTRAMUSCULAR | Status: AC
  Filled 2017-07-31: qty 1

## 2017-07-31 MED ORDER — FAMOTIDINE 20 MG PO TABS
20.0000 mg | ORAL_TABLET | Freq: Once | ORAL | Status: AC
  Administered 2017-07-31: 20 mg via ORAL

## 2017-07-31 MED ORDER — DEXAMETHASONE SODIUM PHOSPHATE 10 MG/ML IJ SOLN
INTRAMUSCULAR | Status: DC | PRN
Start: 2017-07-31 — End: 2017-07-31
  Administered 2017-07-31: 10 mg via INTRAVENOUS

## 2017-07-31 MED ORDER — ONDANSETRON HCL 4 MG/2ML IJ SOLN
INTRAMUSCULAR | Status: DC | PRN
Start: 2017-07-31 — End: 2017-07-31
  Administered 2017-07-31: 4 mg via INTRAVENOUS

## 2017-07-31 MED ORDER — ONDANSETRON HCL 4 MG/2ML IJ SOLN: 4.0000 mg | Freq: Four times a day (QID) | INTRAMUSCULAR | Status: DC | PRN

## 2017-07-31 MED ORDER — ONDANSETRON HCL 4 MG/2ML IJ SOLN: 4.0000 mg | Freq: Once | INTRAMUSCULAR | Status: DC | PRN

## 2017-07-31 SURGICAL SUPPLY — 24 items
BANDAGE ACE 3X5.8 VEL STRL LF (GAUZE/BANDAGES/DRESSINGS) IMPLANT
BANDAGE CONFORM 2X5YD N/S (GAUZE/BANDAGES/DRESSINGS) ×2 IMPLANT
CANISTER SUCT 1200ML W/VALVE (MISCELLANEOUS) ×2 IMPLANT
CHLORAPREP W/TINT 26ML (MISCELLANEOUS) ×2 IMPLANT
CUFF TOURN 18 STER (MISCELLANEOUS) ×2 IMPLANT
DRAPE STERI IOBAN 125X83 (DRAPES) ×2 IMPLANT
GAUZE PETRO XEROFOAM 1X8 (MISCELLANEOUS) ×2 IMPLANT
GAUZE SPONGE 4X4 12PLY STRL (GAUZE/BANDAGES/DRESSINGS) ×2 IMPLANT
GAUZE STRETCH 2X75IN STRL (MISCELLANEOUS) ×2 IMPLANT
GAUZE XEROFORM 4X4 STRL (GAUZE/BANDAGES/DRESSINGS) ×2 IMPLANT
GLOVE SURG SYN 9.0  PF PI (GLOVE) ×1
GLOVE SURG SYN 9.0 PF PI (GLOVE) ×1 IMPLANT
GOWN SRG 2XL LVL 4 RGLN SLV (GOWNS) ×1 IMPLANT
GOWN STRL NON-REIN 2XL LVL4 (GOWNS) ×1
GOWN STRL REUS W/ TWL LRG LVL3 (GOWN DISPOSABLE) ×1 IMPLANT
GOWN STRL REUS W/TWL LRG LVL3 (GOWN DISPOSABLE) ×1
KIT RM TURNOVER STRD PROC AR (KITS) ×2 IMPLANT
NS IRRIG 500ML POUR BTL (IV SOLUTION) ×2 IMPLANT
PACK EXTREMITY ARMC (MISCELLANEOUS) ×2 IMPLANT
PAD CAST CTTN 4X4 STRL (SOFTGOODS) IMPLANT
PADDING CAST COTTON 4X4 STRL (SOFTGOODS)
SUT MNCRL 4-0 (SUTURE)
SUT MNCRL 4-0 27XMFL (SUTURE)
SUTURE MNCRL 4-0 27XMF (SUTURE) IMPLANT

## 2017-07-31 NOTE — Discharge Instructions (Addendum)
Try to keep dressing clean and dry. If it falls off cover incision with Band-Aids. Try not to use her been finger this weekend. Pain medicine as directed. Reinforce dressing if bleeding comes through.   AMBULATORY SURGERY  DISCHARGE INSTRUCTIONS   1) The drugs that you were given will stay in your system until tomorrow so for the next 24 hours you should not:  A) Drive an automobile B) Make any legal decisions C) Drink any alcoholic beverage   2) You may resume regular meals tomorrow.  Today it is better to start with liquids and gradually work up to solid foods.  You may eat anything you prefer, but it is better to start with liquids, then soup and crackers, and gradually work up to solid foods.   3) Please notify your doctor immediately if you have any unusual bleeding, trouble breathing, redness and pain at the surgery site, drainage, fever, or pain not relieved by medication.    4) Additional Instructions: TAKE A STOOL SOFTENER TWICE A DAY WHILE TAKING NARCOTIC PAIN MEDICINE TO PREVENT CONSTIPATION   Please contact your physician with any problems or Same Day Surgery at [phone], Monday through Friday 6 am to 4 pm, or [place] [facility] number at [phone].

## 2017-07-31 NOTE — Anesthesia Post-op Follow-up Note (Signed)
Anesthesia QCDR form completed.        

## 2017-07-31 NOTE — Anesthesia Procedure Notes (Signed)
Procedure Name: LMA Insertion Date/Time: 07/31/2017 7:36 AM Performed by: Nelda Marseille Pre-anesthesia Checklist: Patient identified, Patient being monitored, Timeout performed, Emergency Drugs available and Suction available Patient Re-evaluated:Patient Re-evaluated prior to induction Oxygen Delivery Method: Circle system utilized Preoxygenation: Pre-oxygenation with 100% oxygen Induction Type: IV induction Ventilation: Mask ventilation without difficulty LMA: LMA inserted LMA Size: 3.5 Tube type: Oral Number of attempts: 1 Placement Confirmation: positive ETCO2 and breath sounds checked- equal and bilateral Tube secured with: Tape Dental Injury: Teeth and Oropharynx as per pre-operative assessment

## 2017-07-31 NOTE — Transfer of Care (Signed)
Immediate Anesthesia Transfer of Care Note  Patient: Kim Holloway  Procedure(s) Performed: CYST REMOVAL RIGHT INDEX FINGER (Right )  Patient Location: PACU  Anesthesia Type:General  Level of Consciousness: sedated  Airway & Oxygen Therapy: Patient Spontanous Breathing and Patient connected to face mask oxygen  Post-op Assessment: Report given to RN and Post -op Vital signs reviewed and stable  Post vital signs: Reviewed and stable  Last Vitals:  Vitals:   07/31/17 0606  BP: (!) 203/92  Pulse: 94  Resp: 16  Temp: 36.5 C  SpO2: 98%    Last Pain:  Vitals:   07/31/17 0606  TempSrc: Tympanic         Complications: No apparent anesthesia complications

## 2017-07-31 NOTE — H&P (Signed)
Reviewed paper H+P, will be scanned into chart. No changes noted.  

## 2017-07-31 NOTE — Op Note (Signed)
07/31/2017  7:57 AM  PATIENT:  Kim Holloway  71 y.o. female  PRE-OPERATIVE DIAGNOSIS:  CYST OF JOINT OF RIGHT HAND  POST-OPERATIVE DIAGNOSIS:  CYST OF JOINT OF RIGHT HAND  PROCEDURE:  Procedure(s): CYST REMOVAL RIGHT INDEX FINGER (Right)  SURGEON: Laurene Footman, MD  ASSISTANTS: None  ANESTHESIA:   general  EBL:  Total I/O In: 200 [I.V.:200] Out: 1 [Blood:1]  BLOOD ADMINISTERED:none  DRAINS: none   LOCAL MEDICATIONS USED:  MARCAINE     SPECIMEN:  Source of Specimen:  Mass from right index finger, adjacent to the DIP joint but not cystic, adjacent neuroma also excised  DISPOSITION OF SPECIMEN:  Source of Specimen:  Mass from right index finger adjacent to DIP joint, appeared to be tumor with adjacent neuroma  COUNTS:  YES  TOURNIQUET:   Total Tourniquet Time Documented: Forearm (Right) - 10 minutes Total: Forearm (Right) - 10 minutes   IMPLANTS: None  DICTATION: .Dragon Dictation patient brought the operating room and after adequate anesthesia was obtained the right arm was prepped and draped in sterile fashion. After patient identification and timeout procedure were completed tourniquet was raised and mid axial incision made along the ulnar side of the DIP joint. The skin and subcutaneous tissues tissue was spread and mass was identified it wasn't had a brownish consistency and was so was carefully separated from adjacent tissue getting down to the joint capsule but it did not go into the joint. There was also the volar digital nerve had a large neuroma within the mass and this was excised as well after removal of all the maps the visible and palpable the wound was irrigated and closed with simple interrupted 5-0 nylon at the start of the case a digital block to been placed on the ulnar side of the index finger for postop analgesia 10 cc of Marcaine half percent plain. A dressing was applied with Xeroform 4 x 4's and a 2 inch Kling and patient center comes stable  condition  PLAN OF CARE: Discharge to home after PACU  PATIENT DISPOSITION:  PACU - hemodynamically stable.

## 2017-07-31 NOTE — Anesthesia Preprocedure Evaluation (Signed)
Anesthesia Evaluation  Patient identified by MRN, date of birth, ID band Patient awake    Reviewed: Allergy & Precautions, NPO status , Patient's Chart, lab work & pertinent test results  History of Anesthesia Complications (+) Family history of anesthesia reaction  Airway Mallampati: II       Dental no notable dental hx.    Pulmonary neg pulmonary ROS,    Pulmonary exam normal        Cardiovascular hypertension, Pt. on medications Normal cardiovascular exam     Neuro/Psych negative neurological ROS  negative psych ROS   GI/Hepatic negative GI ROS, Neg liver ROS,   Endo/Other  negative endocrine ROS  Renal/GU negative Renal ROS  negative genitourinary   Musculoskeletal negative musculoskeletal ROS (+)   Abdominal Normal abdominal exam  (+)   Peds negative pediatric ROS (+)  Hematology negative hematology ROS (+)   Anesthesia Other Findings   Reproductive/Obstetrics                             Anesthesia Physical Anesthesia Plan  ASA: II  Anesthesia Plan: General   Post-op Pain Management:    Induction: Intravenous  PONV Risk Score and Plan:   Airway Management Planned: LMA  Additional Equipment:   Intra-op Plan:   Post-operative Plan: Extubation in OR  Informed Consent: I have reviewed the patients History and Physical, chart, labs and discussed the procedure including the risks, benefits and alternatives for the proposed anesthesia with the patient or authorized representative who has indicated his/her understanding and acceptance.     Plan Discussed with: CRNA and Surgeon  Anesthesia Plan Comments:         Anesthesia Quick Evaluation

## 2017-08-01 ENCOUNTER — Encounter: Payer: Self-pay | Admitting: Orthopedic Surgery

## 2017-08-01 LAB — SURGICAL PATHOLOGY

## 2017-08-01 NOTE — Anesthesia Postprocedure Evaluation (Signed)
Anesthesia Post Note  Patient: Kim Holloway  Procedure(s) Performed: CYST REMOVAL RIGHT INDEX FINGER (Right )  Patient location during evaluation: PACU Anesthesia Type: General Level of consciousness: awake and alert and oriented Pain management: pain level controlled Vital Signs Assessment: post-procedure vital signs reviewed and stable Respiratory status: spontaneous breathing Cardiovascular status: blood pressure returned to baseline Anesthetic complications: no     Last Vitals:  Vitals:   07/31/17 0831 07/31/17 0855  BP: 140/65 (!) 143/76  Pulse: 72 77  Resp: 12 16  Temp:    SpO2: 99% 98%    Last Pain:  Vitals:   08/01/17 0831  TempSrc:   PainSc: 0-No pain                 Manpreet Strey

## 2017-08-04 ENCOUNTER — Ambulatory Visit
Admission: RE | Admit: 2017-08-04 | Discharge: 2017-08-04 | Disposition: A | Discharge status: Home / Self Care | Payer: Medicare Other | Source: Ambulatory Visit | Attending: Primary Care | Admitting: Primary Care

## 2017-08-04 DIAGNOSIS — Z13820 Encounter for screening for osteoporosis: Secondary | ICD-10-CM | POA: Diagnosis not present

## 2017-08-04 DIAGNOSIS — E2839 Other primary ovarian failure: Secondary | ICD-10-CM

## 2017-08-04 DIAGNOSIS — Z780 Asymptomatic menopausal state: Secondary | ICD-10-CM | POA: Diagnosis not present

## 2017-09-08 DIAGNOSIS — H40003 Preglaucoma, unspecified, bilateral: Secondary | ICD-10-CM | POA: Diagnosis not present

## 2017-12-17 ENCOUNTER — Encounter: Payer: Self-pay | Admitting: Primary Care

## 2017-12-17 DIAGNOSIS — I10 Essential (primary) hypertension: Secondary | ICD-10-CM

## 2017-12-18 ENCOUNTER — Telehealth: Payer: Self-pay | Admitting: Primary Care

## 2017-12-18 MED ORDER — IRBESARTAN-HYDROCHLOROTHIAZIDE 150-12.5 MG PO TABS
ORAL_TABLET | ORAL | 0 refills | Status: DC
Start: 2017-12-18 — End: 2018-01-02

## 2017-12-18 NOTE — Telephone Encounter (Signed)
See My Chart message. I already switched her to irbesartan-HCTZ. She needs follow up BP visit in 2 weeks. Please schedule.

## 2017-12-18 NOTE — Telephone Encounter (Signed)
Copied from [place] [id number]. Topic: Quick Communication - See Telephone Encounter >> Dec 18, 2017  1:11 PM Synthia Innocent wrote: CRM for notification. See Telephone encounter for: patient states that losartan-hydrochlorothiazide (HYZAAR) 100-12.5 MG tablet has been recalled, can something else be called in? [facility], Insurance ID # [id number] 12/18/17.

## 2017-12-18 NOTE — Telephone Encounter (Signed)
Called pt to verify if she had reached out to find out if her lot number was one that was effected. She said yes, per optum, her current medication is in one of the effected lots. She needs a replacement medication.

## 2017-12-19 NOTE — Telephone Encounter (Signed)
I called the pt and informed her of the message below.  Follow up appt scheduled for 3/29.

## 2018-01-02 ENCOUNTER — Encounter: Payer: Self-pay | Admitting: Primary Care

## 2018-01-02 ENCOUNTER — Ambulatory Visit (INDEPENDENT_AMBULATORY_CARE_PROVIDER_SITE_OTHER): Payer: Medicare Other | Admitting: Primary Care

## 2018-01-02 ENCOUNTER — Other Ambulatory Visit: Payer: Self-pay | Admitting: Primary Care

## 2018-01-02 DIAGNOSIS — E876 Hypokalemia: Secondary | ICD-10-CM

## 2018-01-02 DIAGNOSIS — I10 Essential (primary) hypertension: Secondary | ICD-10-CM | POA: Diagnosis not present

## 2018-01-02 LAB — BASIC METABOLIC PANEL
BUN: 11 mg/dL (ref 6–23)
CALCIUM: 8.8 mg/dL (ref 8.4–10.5)
CO2: 31 meq/L (ref 19–32)
Chloride: 102 mEq/L (ref 96–112)
Creatinine, Ser: 0.65 mg/dL (ref 0.40–1.20)
GFR: 95.36 mL/min (ref >60.00)
GLUCOSE: 152 mg/dL — AB (ref 70–99)
Potassium: 3.3 mEq/L — ABNORMAL LOW (ref 3.5–5.1)
Sodium: 141 mEq/L (ref 135–145)

## 2018-01-02 MED ORDER — IRBESARTAN-HYDROCHLOROTHIAZIDE 150-12.5 MG PO TABS: ORAL_TABLET | ORAL | 3 refills | Status: DC

## 2018-01-02 NOTE — Assessment & Plan Note (Signed)
Stable since switching from Hyzaar to Avalide, home readings lower than office readings. Continue Avalide at current dose. BMP pending.

## 2018-01-02 NOTE — Progress Notes (Signed)
Subjective:    Patient ID: Kim Holloway, female    DOB: 1946-06-07, 72 y.o.   MRN: [id number]  HPI  Kim Holloway is a 72 year old female who presents today for follow up of hypertension.   She is currently managed on irbesartan-HCTZ 150-12.5 mg which was switched two weeks ago from her recalled medication of losartan-HCTZ 100-12.5 mg.   Since her last visit she's doing well. She's been checking her BP at home and is getting readings of 130/70's-80's. She denies chest pain, dizziness, headaches, shortness of breath.   BP Readings from Last 3 Encounters:  01/02/18 140/86  07/31/17 (!) 143/76  07/21/17 (!) 162/77     Review of Systems  Constitutional: Negative for fatigue.  Eyes: Negative for visual disturbance.  Respiratory: Negative for shortness of breath.   Cardiovascular: Negative for chest pain.  Neurological: Negative for dizziness and headaches.       Past Medical History:  Diagnosis Date  . Abnormal breast biopsy 10/04/2013   right breast  . Family history of adverse reaction to anesthesia 2016   brother had difficulty waking up after surgery  . History of colonoscopy 2008  . Hypertension   . Mammographic calcification 06/2017     Social History   Socioeconomic History  . Marital status: Married    Spouse name: Not on file  . Number of children: Not on file  . Years of education: Not on file  . Highest education level: Not on file  Occupational History  . Not on file  Social Needs  . Financial resource strain: Not on file  . Food insecurity:    Worry: Not on file    Inability: Not on file  . Transportation needs:    Medical: Not on file    Non-medical: Not on file  Tobacco Use  . Smoking status: Never Smoker  . Smokeless tobacco: Never Used  Substance and Sexual Activity  . Alcohol use: No  . Drug use: No  . Sexual activity: Not on file  Lifestyle  . Physical activity:    Days per week: Not on file    Minutes per session: Not on file  . Stress:  Not on file  Relationships  . Social connections:    Talks on phone: Not on file    Gets together: Not on file    Attends religious service: Not on file    Active member of club or organization: Not on file    Attends meetings of clubs or organizations: Not on file    Relationship status: Not on file  . Intimate partner violence:    Fear of current or ex partner: Not on file    Emotionally abused: Not on file    Physically abused: Not on file    Forced sexual activity: Not on file  Other Topics Concern  . Not on file  Social History Narrative   Married.   Moved from [place]. Originally from [place].   Retired.       Past Surgical History:  Procedure Laterality Date  . BREAST BIOPSY Right 2014   stereo bx neg   . BREAST CYST ASPIRATION Left    possible biopsy at the same time  . CATARACT EXTRACTION  01/19/2016, 02/19/2016  . COLONOSCOPY  2008  . CYST EXCISION Right 07/31/2017   Procedure: CYST REMOVAL RIGHT INDEX FINGER;  Surgeon: Hessie Knows, MD;  Location: [facility] ORS;  Service: Orthopedics;  Laterality: Right;  . EYE SURGERY Left 12/2016  laser surgery    No family history on file.  No Known Allergies  Current Outpatient Medications on File Prior to Visit  Medication Sig Dispense Refill  . aspirin EC 81 MG tablet Take 81 mg by mouth daily.    . Calcium Carb-Cholecalciferol (CALCIUM 600 + D PO) Take 2 tablets by mouth daily.    . cholecalciferol (VITAMIN D) 1000 units tablet Take 1,000 Units by mouth daily.     No current facility-administered medications on file prior to visit.     BP 140/86   Pulse 80   Temp 97.8 F (36.6 C) (Oral)   Wt 168 lb (76.2 kg)   SpO2 97%   BMI 28.17 kg/m    Objective:   Physical Exam  Constitutional: She appears well-nourished.  Neck: Neck supple.  Cardiovascular: Normal rate and regular rhythm.  Pulmonary/Chest: Effort normal and breath sounds normal.  Skin: Skin is warm and dry.          Assessment & Plan:

## 2018-01-02 NOTE — Patient Instructions (Signed)
Stop by the lab prior to leaving today. I will notify you of your results once received.   Continue taking irbesartan-hydrochlorothiazide 150-12.5 mg tablets for high blood pressure.  It was a pleasure to see you today!

## 2018-01-07 ENCOUNTER — Other Ambulatory Visit (INDEPENDENT_AMBULATORY_CARE_PROVIDER_SITE_OTHER): Payer: Medicare Other

## 2018-01-07 DIAGNOSIS — E876 Hypokalemia: Secondary | ICD-10-CM

## 2018-01-07 LAB — POTASSIUM: Potassium: 3.7 mEq/L (ref 3.5–5.1)

## 2018-01-21 DIAGNOSIS — R9431 Abnormal electrocardiogram [ECG] [EKG]: Secondary | ICD-10-CM | POA: Diagnosis not present

## 2018-01-21 DIAGNOSIS — I10 Essential (primary) hypertension: Secondary | ICD-10-CM | POA: Diagnosis not present

## 2018-01-21 DIAGNOSIS — M1990 Unspecified osteoarthritis, unspecified site: Secondary | ICD-10-CM | POA: Diagnosis not present

## 2018-01-21 DIAGNOSIS — I208 Other forms of angina pectoris: Secondary | ICD-10-CM | POA: Diagnosis not present

## 2018-01-21 DIAGNOSIS — R0602 Shortness of breath: Secondary | ICD-10-CM | POA: Diagnosis not present

## 2018-01-21 DIAGNOSIS — M25841 Other specified joint disorders, right hand: Secondary | ICD-10-CM | POA: Diagnosis not present

## 2018-02-04 DIAGNOSIS — R0602 Shortness of breath: Secondary | ICD-10-CM | POA: Diagnosis not present

## 2018-02-04 DIAGNOSIS — I208 Other forms of angina pectoris: Secondary | ICD-10-CM | POA: Diagnosis not present

## 2018-02-10 DIAGNOSIS — I10 Essential (primary) hypertension: Secondary | ICD-10-CM | POA: Diagnosis not present

## 2018-02-10 DIAGNOSIS — M1990 Unspecified osteoarthritis, unspecified site: Secondary | ICD-10-CM | POA: Diagnosis not present

## 2018-02-10 DIAGNOSIS — R9431 Abnormal electrocardiogram [ECG] [EKG]: Secondary | ICD-10-CM | POA: Diagnosis not present

## 2018-02-10 DIAGNOSIS — I208 Other forms of angina pectoris: Secondary | ICD-10-CM | POA: Diagnosis not present

## 2018-02-10 DIAGNOSIS — R0602 Shortness of breath: Secondary | ICD-10-CM | POA: Diagnosis not present

## 2018-02-10 DIAGNOSIS — M25841 Other specified joint disorders, right hand: Secondary | ICD-10-CM | POA: Diagnosis not present

## 2018-03-09 DIAGNOSIS — H40003 Preglaucoma, unspecified, bilateral: Secondary | ICD-10-CM | POA: Diagnosis not present

## 2018-03-19 DIAGNOSIS — H40003 Preglaucoma, unspecified, bilateral: Secondary | ICD-10-CM | POA: Diagnosis not present

## 2018-05-18 ENCOUNTER — Other Ambulatory Visit: Payer: Self-pay | Admitting: Primary Care

## 2018-05-18 DIAGNOSIS — Z1231 Encounter for screening mammogram for malignant neoplasm of breast: Secondary | ICD-10-CM

## 2018-06-04 ENCOUNTER — Ambulatory Visit
Admission: RE | Admit: 2018-06-04 | Discharge: 2018-06-04 | Disposition: A | Discharge status: Home / Self Care | Payer: Medicare Other | Source: Ambulatory Visit | Attending: Primary Care | Admitting: Primary Care

## 2018-06-04 DIAGNOSIS — Z1231 Encounter for screening mammogram for malignant neoplasm of breast: Secondary | ICD-10-CM | POA: Insufficient documentation

## 2018-06-25 ENCOUNTER — Other Ambulatory Visit: Payer: Self-pay | Admitting: Primary Care

## 2018-06-25 DIAGNOSIS — I10 Essential (primary) hypertension: Secondary | ICD-10-CM

## 2018-06-26 ENCOUNTER — Ambulatory Visit: Payer: Medicare Other

## 2018-06-26 ENCOUNTER — Ambulatory Visit (INDEPENDENT_AMBULATORY_CARE_PROVIDER_SITE_OTHER): Payer: Medicare Other

## 2018-06-26 VITALS — BP 118/88 | HR 65 | Temp 97.7°F | Ht 64.25 in | Wt 169.0 lb

## 2018-06-26 DIAGNOSIS — I10 Essential (primary) hypertension: Secondary | ICD-10-CM | POA: Diagnosis not present

## 2018-06-26 DIAGNOSIS — Z0000 Encounter for general adult medical examination without abnormal findings: Secondary | ICD-10-CM | POA: Diagnosis not present

## 2018-06-26 LAB — COMPREHENSIVE METABOLIC PANEL
ALK PHOS: 93 U/L (ref 39–117)
ALT: 30 U/L (ref 0–35)
AST: 20 U/L (ref 0–37)
Albumin: 4.1 g/dL (ref 3.5–5.2)
BILIRUBIN TOTAL: 0.5 mg/dL (ref 0.2–1.2)
BUN: 10 mg/dL (ref 6–23)
CO2: 32 mEq/L (ref 19–32)
Calcium: 8.9 mg/dL (ref 8.4–10.5)
Chloride: 103 mEq/L (ref 96–112)
Creatinine, Ser: 0.7 mg/dL (ref 0.40–1.20)
GFR: 87.42 mL/min (ref >60.00)
Glucose, Bld: 121 mg/dL — ABNORMAL HIGH (ref 70–99)
POTASSIUM: 3.9 meq/L (ref 3.5–5.1)
Sodium: 141 mEq/L (ref 135–145)
TOTAL PROTEIN: 6.8 g/dL (ref 6.0–8.3)

## 2018-06-26 LAB — LIPID PANEL
CHOL/HDL RATIO: 3
Cholesterol: 170 mg/dL (ref 0–200)
HDL: 51.5 mg/dL (ref >39.00)
LDL Cholesterol: 98 mg/dL (ref 0–99)
NONHDL: 118.64
Triglycerides: 102 mg/dL (ref 0.0–149.0)
VLDL: 20.4 mg/dL (ref 0.0–40.0)

## 2018-06-26 NOTE — Patient Instructions (Signed)
Ms. Goodnow , Thank you for taking time to come for your Medicare Wellness Visit. I appreciate your ongoing commitment to your health goals. Please review the following plan we discussed and let me know if I can assist you in the future.   These are the goals we discussed: Goals    . Increase physical activity     Starting 06/26/2018, I will continue to exercise for 60 minutes 7 days per week.        This is a list of the screening recommended for you and due dates:  Health Maintenance  Topic Date Due  . Flu Shot  07/06/2049*  . Cologuard (Stool DNA test)  07/08/2019  . Mammogram  06/04/2020  . Tetanus Vaccine  07/08/2023  . DEXA scan (bone density measurement)  Completed  .  Hepatitis C: One time screening is recommended by Center for Disease Control  (CDC) for  adults born from [birth year] through 1965.   Completed  . Pneumonia vaccines  Completed  *Topic was postponed. The date shown is not the original due date.   Preventive Care for Adults  A healthy lifestyle and preventive care can promote health and wellness. Preventive health guidelines for adults include the following key practices.  . A routine yearly physical is a good way to check with your health care provider about your health and preventive screening. It is a chance to share any concerns and updates on your health and to receive a thorough exam.  . Visit your dentist for a routine exam and preventive care every 6 months. Brush your teeth twice a day and floss once a day. Good oral hygiene prevents tooth decay and gum disease.  . The frequency of eye exams is based on your age, health, family medical history, use  of contact lenses, and other factors. Follow your health care provider's recommendations for frequency of eye exams.  . Eat a healthy diet. Foods like vegetables, fruits, whole grains, low-fat dairy products, and lean protein foods contain the nutrients you need without too many calories. Decrease your intake of foods  high in solid fats, added sugars, and salt. Eat the right amount of calories for you. Get information about a proper diet from your health care provider, if necessary.  . Regular physical exercise is one of the most important things you can do for your health. Most adults should get at least 150 minutes of moderate-intensity exercise (any activity that increases your heart rate and causes you to sweat) each week. In addition, most adults need muscle-strengthening exercises on 2 or more days a week.  Silver Sneakers may be a benefit available to you. To determine eligibility, you may visit the website: www.silversneakers.com or contact program at (813)098-7901 Mon-Fri between 8AM-8PM.   . Maintain a healthy weight. The body mass index (BMI) is a screening tool to identify possible weight problems. It provides an estimate of body fat based on height and weight. Your health care provider can find your BMI and can help you achieve or maintain a healthy weight.   For adults 20 years and older: ? A BMI below 18.5 is considered underweight. ? A BMI of 18.5 to 24.9 is normal. ? A BMI of 25 to 29.9 is considered overweight. ? A BMI of 30 and above is considered obese.   . Maintain normal blood lipids and cholesterol levels by exercising and minimizing your intake of saturated fat. Eat a balanced diet with plenty of fruit and vegetables. Blood tests for  lipids and cholesterol should begin at age 62 and be repeated every 5 years. If your lipid or cholesterol levels are high, you are over 50, or you are at high risk for heart disease, you may need your cholesterol levels checked more frequently. Ongoing high lipid and cholesterol levels should be treated with medicines if diet and exercise are not working.  . If you smoke, find out from your health care provider how to quit. If you do not use tobacco, please do not start.  . If you choose to drink alcohol, please do not consume more than 2 drinks per day.  One drink is considered to be 12 ounces (355 mL) of beer, 5 ounces (148 mL) of wine, or 1.5 ounces (44 mL) of liquor.  . If you are 60-9 years old, ask your health care provider if you should take aspirin to prevent strokes.  . Use sunscreen. Apply sunscreen liberally and repeatedly throughout the day. You should seek shade when your shadow is shorter than you. Protect yourself by wearing long sleeves, pants, a wide-brimmed hat, and sunglasses year round, whenever you are outdoors.  . Once a month, do a whole body skin exam, using a mirror to look at the skin on your back. Tell your health care provider of new moles, moles that have irregular borders, moles that are larger than a pencil eraser, or moles that have changed in shape or color.

## 2018-06-28 NOTE — Progress Notes (Signed)
PCP notes:   Health maintenance:  Flu vaccine - pt declined  Abnormal screenings:   Hearing - failed  Hearing Screening   125Hz  250Hz  500Hz  1000Hz  2000Hz  3000Hz  4000Hz  6000Hz  8000Hz   Right ear:   0 0 40  40    Left ear:   40 40 40  40     Patient concerns:   None  Nurse concerns:  None  Next PCP appt:   06/30/18 @ 0820

## 2018-06-28 NOTE — Progress Notes (Signed)
Subjective:   Kim Holloway is a 72 y.o. female who presents for Medicare Annual (Subsequent) preventive examination.  Review of Systems:  N/A Cardiac Risk Factors include: advanced age (>38men, >21 women);hypertension     Objective:     Vitals: BP 118/88 (BP Location: Right Arm, Patient Position: Sitting, Cuff Size: Normal)   Pulse 65   Temp 97.7 F (36.5 C) (Oral)   Ht 5' 4.25" (1.632 m) Comment: no shoes  Wt 169 lb (76.7 kg)   SpO2 98%   BMI 28.78 kg/m   Body mass index is 28.78 kg/m.  Advanced Directives 06/26/2018 07/31/2017 07/21/2017 06/23/2017  Does Patient Have a Medical Advance Directive? No No No Yes  Type of Advance Directive - - Public librarian;Living will  Does patient want to make changes to medical advance directive? - - - Yes (MAU/Ambulatory/Procedural Areas - Information given)  Copy of [place] in Chart? - - - No - copy requested  Would patient like information on creating a medical advance directive? No - Patient declined No - Patient declined - -    Tobacco Social History   Tobacco Use  Smoking Status Never Smoker  Smokeless Tobacco Never Used     Counseling given: No   Clinical Intake:     Pain : No/denies pain Pain Score: 0-No pain     Nutritional Status: BMI 25 -29 Overweight Nutritional Risks: None Diabetes: No  How often do you need to have someone help you when you read instructions, pamphlets, or other written materials from your doctor or pharmacy?: 1 - Never  Interpreter Needed?: No  Information entered by :: LPinson, LPN  Past Medical History:  Diagnosis Date  . Abnormal breast biopsy 10/04/2013   right breast  . Family history of adverse reaction to anesthesia 2016   brother had difficulty waking up after surgery  . History of colonoscopy 2008  . Hypertension   . Mammographic calcification 06/2017   Past Surgical History:  Procedure Laterality Date  . BREAST BIOPSY Right 2014   stereo bx neg   . BREAST CYST ASPIRATION Left    possible biopsy at the same time  . CATARACT EXTRACTION  01/19/2016, 02/19/2016  . COLONOSCOPY  2008  . CYST EXCISION Right 07/31/2017   Procedure: CYST REMOVAL RIGHT INDEX FINGER;  Surgeon: Kim Knows, MD;  Location: [facility] ORS;  Service: Orthopedics;  Laterality: Right;  . EYE SURGERY Left 12/2016   laser surgery   Family History  Problem Relation Age of Onset  . Breast cancer Neg Hx    Social History   Socioeconomic History  . Marital status: Married    Spouse name: Not on file  . Number of children: Not on file  . Years of education: Not on file  . Highest education level: Not on file  Occupational History  . Not on file  Social Needs  . Financial resource strain: Not on file  . Food insecurity:    Worry: Not on file    Inability: Not on file  . Transportation needs:    Medical: Not on file    Non-medical: Not on file  Tobacco Use  . Smoking status: Never Smoker  . Smokeless tobacco: Never Used  Substance and Sexual Activity  . Alcohol use: No  . Drug use: No  . Sexual activity: Not on file  Lifestyle  . Physical activity:    Days per week: Not on file    Minutes per session:  Not on file  . Stress: Not on file  Relationships  . Social connections:    Talks on phone: Not on file    Gets together: Not on file    Attends religious service: Not on file    Active member of club or organization: Not on file    Attends meetings of clubs or organizations: Not on file    Relationship status: Not on file  Other Topics Concern  . Not on file  Social History Narrative   Married.   Moved from [place]. Originally from [place].   Retired.       Outpatient Encounter Medications as of 06/26/2018  Medication Sig  . aspirin EC 81 MG tablet Take 81 mg by mouth daily.  . Calcium Carb-Cholecalciferol (CALCIUM 600 + D PO) Take 2 tablets by mouth daily.  . cholecalciferol (VITAMIN D) 1000 units tablet Take 1,000 Units by mouth  daily.  . irbesartan-hydrochlorothiazide (AVALIDE) 150-12.5 MG tablet Take 1 tablet by mouth once daily for high blood pressure.   No facility-administered encounter medications on file as of 06/26/2018.     Activities of Daily Living In your present state of health, do you have any difficulty performing the following activities: 06/28/2018 07/21/2017  Hearing? N N  Vision? N N  Difficulty concentrating or making decisions? N N  Walking or climbing stairs? N N  Dressing or bathing? N N  Doing errands, shopping? N N  Preparing Food and eating ? N -  Using the Toilet? N -  In the past six months, have you accidently leaked urine? N -  Do you have problems with loss of bowel control? N -  Managing your Medications? N -  Managing your Finances? N -  Housekeeping or managing your Housekeeping? N -  Some recent data might be hidden    Patient Care Team: Pleas Koch, NP as PCP - General (Internal Medicine)    Assessment:   This is a routine wellness examination for Kim Holloway.   Hearing Screening   125Hz  250Hz  500Hz  1000Hz  2000Hz  3000Hz  4000Hz  6000Hz  8000Hz   Right ear:   0 0 40  40    Left ear:   40 40 40  40    Vision Screening Comments: Vision exam in June 2019 with Dr. Wallace Holloway    Exercise Activities and Dietary recommendations Current Exercise Habits: Home exercise routine, Type of exercise: Other - see comments;walking(water aerobics), Time (Minutes): 60, Frequency (Times/Week): 7, Weekly Exercise (Minutes/Week): 420, Intensity: Moderate, Exercise limited by: None identified  Goals    . Increase physical activity     Starting 06/26/2018, I will continue to exercise for 60 minutes 7 days per week.        Fall Risk Fall Risk  06/28/2018 06/23/2017  Falls in the past year? No No    Depression Screen PHQ 2/9 Scores 06/28/2018 06/23/2017  PHQ - 2 Score 0 0  PHQ- 9 Score 0 1     Cognitive Function MMSE - Mini Mental State Exam 06/28/2018 06/23/2017  Orientation to  time 5 5  Orientation to Place 5 5  Registration 3 3  Attention/ Calculation 0 0  Recall 3 3  Language- name 2 objects 0 0  Language- repeat 1 1  Language- follow 3 step command 3 3  Language- read & follow direction 0 0  Write a sentence 0 0  Copy design 0 0  Total score 20 20     PLEASE NOTE: A Mini-Cog screen was completed.  Maximum score is 20. A value of 0 denotes this part of [place] MMSE was not completed or the patient failed this part of the Mini-Cog screening.   Mini-Cog Screening Orientation to Time - Max 5 pts Orientation to Place - Max 5 pts Registration - Max 3 pts Recall - Max 3 pts Language Repeat - Max 1 pts Language Follow 3 Step Command - Max 3 pts     Immunization History  Administered Date(s) Administered  . Pneumococcal Conjugate-13 11/30/2014  . Pneumococcal Polysaccharide-23 07/07/2013  . Tdap 07/07/2013  . Zoster 01/01/2016   Screening Tests Health Maintenance  Topic Date Due  . INFLUENZA VACCINE  07/06/2049 (Originally 05/07/2018)  . Fecal DNA (Cologuard)  07/08/2019  . MAMMOGRAM  06/04/2020  . TETANUS/TDAP  07/08/2023  . DEXA SCAN  Completed  . Hepatitis C Screening  Completed  . PNA vac Low Risk Adult  Completed      Plan:     I have personally reviewed, addressed, and noted the following in the patient's chart:  A. Medical and social history B. Use of alcohol, tobacco or illicit drugs  C. Current medications and supplements D. Functional ability and status E.  Nutritional status F.  Physical activity G. Advance directives H. List of other physicians I.  Hospitalizations, surgeries, and ER visits in previous 12 months J.  [place] to include hearing, vision, cognitive, depression L. Referrals and appointments - none  In addition, I have reviewed and discussed with patient certain preventive protocols, quality metrics, and best practice recommendations. A written personalized care plan for preventive services as well as  general preventive health recommendations were provided to patient.  See attached scanned questionnaire for additional information.   Signed,   Lindell Noe, MHA, BS, LPN Health Coach

## 2018-06-30 ENCOUNTER — Encounter: Payer: Medicare Other | Admitting: Primary Care

## 2018-06-30 ENCOUNTER — Encounter: Payer: Self-pay | Admitting: Primary Care

## 2018-06-30 ENCOUNTER — Other Ambulatory Visit: Payer: Self-pay | Admitting: Primary Care

## 2018-06-30 ENCOUNTER — Ambulatory Visit (INDEPENDENT_AMBULATORY_CARE_PROVIDER_SITE_OTHER): Payer: Medicare Other | Admitting: Primary Care

## 2018-06-30 VITALS — BP 124/84 | HR 71 | Temp 98.0°F | Ht 64.25 in | Wt 172.5 lb

## 2018-06-30 DIAGNOSIS — H409 Unspecified glaucoma: Secondary | ICD-10-CM

## 2018-06-30 DIAGNOSIS — E119 Type 2 diabetes mellitus without complications: Secondary | ICD-10-CM | POA: Insufficient documentation

## 2018-06-30 DIAGNOSIS — R739 Hyperglycemia, unspecified: Secondary | ICD-10-CM

## 2018-06-30 DIAGNOSIS — R7303 Prediabetes: Secondary | ICD-10-CM

## 2018-06-30 DIAGNOSIS — I10 Essential (primary) hypertension: Secondary | ICD-10-CM

## 2018-06-30 DIAGNOSIS — E1165 Type 2 diabetes mellitus with hyperglycemia: Secondary | ICD-10-CM | POA: Insufficient documentation

## 2018-06-30 LAB — POCT GLYCOSYLATED HEMOGLOBIN (HGB A1C): HEMOGLOBIN A1C: 6 % — AB (ref 4.0–5.6)

## 2018-06-30 NOTE — Assessment & Plan Note (Signed)
Borderline. Followed by ophthalmology.

## 2018-06-30 NOTE — Assessment & Plan Note (Addendum)
Fasting glucose of 121 on recent labs. A1C pending.  Update. A1C of 6.0, will notify patient that she is in the prediabetic range. Will have her work on lifestyle changes.

## 2018-06-30 NOTE — Assessment & Plan Note (Signed)
Stable in the office today, continue irbesartan-HCTZ. BMP unremarkable.

## 2018-06-30 NOTE — Progress Notes (Signed)
Subjective:    Patient ID: Kim Holloway, female    DOB: 11-21-1945, 72 y.o.   MRN: [id number]  HPI  Kim Holloway is a 72 year old who presents today for [place] Part 2.   1) Glaucoma: Following with ophthalmology. Currently not on any drops.  2) Essential Hypertension: Currently managed on irbesartan-HCTZ 150-12.5 mg. She denies chest pain, dizziness, shortness of breath.   BP Readings from Last 3 Encounters:  06/30/18 124/84  06/26/18 118/88  01/02/18 140/86     Immunizations: -Tetanus: Completed in 2014 -Influenza: Declines -Pneumonia: Completed last in 2016 -Shingles: Completed in 2017  Diet currently consists of:  Breakfast: Bagel, cereal, yogurt Lunch: Salad Dinner: Meat, vegetables, starch Snacks: Ice cream, fruit, salsa with pita chips Desserts: 3-4 times weekly Beverages: Hot tea, coffee, diet soda, some water  Exercise: She is walking 2-3 miles daily 2-3 days weekly. Aerobics for 3 times weekly.    Eye exam: Completed in June 2019 Dental exam: No recent exam Colonoscopy: Completed Cologuard in 2018 Dexa: Completed in 2018, normal. Compliant to calcium and vitamin D. Mammogram: Completed in August 2019 Hep C Screen: Negative in 2018   Review of Systems  Constitutional: Negative for unexpected weight change.  HENT: Negative for rhinorrhea.   Respiratory: Negative for cough and shortness of breath.   Cardiovascular: Negative for chest pain.  Gastrointestinal: Negative for constipation and diarrhea.  Genitourinary: Negative for difficulty urinating and menstrual problem.  Musculoskeletal: Negative for arthralgias and myalgias.  Skin: Negative for rash.  Allergic/Immunologic: Negative for environmental allergies.  Neurological: Negative for dizziness, numbness and headaches.  Psychiatric/Behavioral: The patient is not nervous/anxious.        Past Medical History:  Diagnosis Date  . Abnormal breast biopsy 10/04/2013   right breast  . Family history of  adverse reaction to anesthesia 2016   brother had difficulty waking up after surgery  . History of colonoscopy 2008  . Hypertension   . Mammographic calcification 06/2017     Social History   Socioeconomic History  . Marital status: Married    Spouse name: Not on file  . Number of children: Not on file  . Years of education: Not on file  . Highest education level: Not on file  Occupational History  . Not on file  Social Needs  . Financial resource strain: Not on file  . Food insecurity:    Worry: Not on file    Inability: Not on file  . Transportation needs:    Medical: Not on file    Non-medical: Not on file  Tobacco Use  . Smoking status: Never Smoker  . Smokeless tobacco: Never Used  Substance and Sexual Activity  . Alcohol use: No  . Drug use: No  . Sexual activity: Not on file  Lifestyle  . Physical activity:    Days per week: Not on file    Minutes per session: Not on file  . Stress: Not on file  Relationships  . Social connections:    Talks on phone: Not on file    Gets together: Not on file    Attends religious service: Not on file    Active member of club or organization: Not on file    Attends meetings of clubs or organizations: Not on file    Relationship status: Not on file  . Intimate partner violence:    Fear of current or ex partner: Not on file    Emotionally abused: Not on file  Physically abused: Not on file    Forced sexual activity: Not on file  Other Topics Concern  . Not on file  Social History Narrative   Married.   Moved from [place]. Originally from [place].   Retired.       Past Surgical History:  Procedure Laterality Date  . BREAST BIOPSY Right 2014   stereo bx neg   . BREAST CYST ASPIRATION Left    possible biopsy at the same time  . CATARACT EXTRACTION  01/19/2016, 02/19/2016  . COLONOSCOPY  2008  . CYST EXCISION Right 07/31/2017   Procedure: CYST REMOVAL RIGHT INDEX FINGER;  Surgeon: Hessie Knows, MD;  Location: [facility] ORS;   Service: Orthopedics;  Laterality: Right;  . EYE SURGERY Left 12/2016   laser surgery    Family History  Problem Relation Age of Onset  . Breast cancer Neg Hx     No Known Allergies  Current Outpatient Medications on File Prior to Visit  Medication Sig Dispense Refill  . aspirin EC 81 MG tablet Take 81 mg by mouth daily.    . Calcium Carb-Cholecalciferol (CALCIUM 600 + D PO) Take 2 tablets by mouth daily.    . cholecalciferol (VITAMIN D) 1000 units tablet Take 1,000 Units by mouth daily.    . irbesartan-hydrochlorothiazide (AVALIDE) 150-12.5 MG tablet Take 1 tablet by mouth once daily for high blood pressure. 90 tablet 3   No current facility-administered medications on file prior to visit.     BP 124/84   Pulse 71   Temp 98 F (36.7 C) (Oral)   Ht 5' 4.25" (1.632 m)   Wt 172 lb 8 oz (78.2 kg)   SpO2 98%   BMI 29.38 kg/m    Objective:   Physical Exam  Constitutional: She is oriented to person, place, and time. She appears well-nourished.  HENT:  Mouth/Throat: No oropharyngeal exudate.  Eyes: Pupils are equal, round, and reactive to light. EOM are normal.  Neck: Neck supple. No thyromegaly present.  Cardiovascular: Normal rate and regular rhythm.  Respiratory: Effort normal and breath sounds normal.  GI: Soft. Bowel sounds are normal. There is no tenderness.  Musculoskeletal: Normal range of motion.  Neurological: She is alert and oriented to person, place, and time.  Skin: Skin is warm and dry.  Psychiatric: She has a normal mood and affect.           Assessment & Plan:

## 2018-06-30 NOTE — Patient Instructions (Signed)
Stop by the lab prior to leaving today. I will notify you of your results once received.   Continue exercising. You should be getting 150 minutes of moderate intensity exercise weekly.  Continue to work on [employer]. Make sure to eat plenty of vegetables, fruit, whole grains.   Follow up in 1 year for your annual exam or sooner if needed.  It was a pleasure to see you today!

## 2018-07-01 NOTE — Progress Notes (Signed)
I reviewed health advisor's note, was available for consultation, and agree with documentation and plan.  

## 2018-09-15 DIAGNOSIS — H40003 Preglaucoma, unspecified, bilateral: Secondary | ICD-10-CM | POA: Diagnosis not present

## 2018-11-09 ENCOUNTER — Other Ambulatory Visit: Payer: Self-pay | Admitting: Primary Care

## 2018-11-09 DIAGNOSIS — I10 Essential (primary) hypertension: Secondary | ICD-10-CM

## 2019-03-23 DIAGNOSIS — H40003 Preglaucoma, unspecified, bilateral: Secondary | ICD-10-CM | POA: Diagnosis not present

## 2019-03-30 DIAGNOSIS — H40003 Preglaucoma, unspecified, bilateral: Secondary | ICD-10-CM | POA: Diagnosis not present

## 2019-03-30 DIAGNOSIS — H35371 Puckering of macula, right eye: Secondary | ICD-10-CM | POA: Diagnosis not present

## 2019-04-21 ENCOUNTER — Other Ambulatory Visit: Payer: Self-pay | Admitting: Primary Care

## 2019-04-21 DIAGNOSIS — Z1231 Encounter for screening mammogram for malignant neoplasm of breast: Secondary | ICD-10-CM

## 2019-05-17 IMAGING — US US BREAST*R* LIMITED INC AXILLA
1 series · 6 of 6 positions shown · non-contrast
Comparison: Previous exam(s).

CLINICAL DATA: Possible asymmetry in the outer right breast on a
recent screening mammogram.

EXAM:
2D DIGITAL DIAGNOSTIC RIGHT MAMMOGRAM WITH CAD AND ADJUNCT TOMO
ULTRASOUND RIGHT BREAST

[Series 1: us breast*right* limited inc axilla · 0.06mm/px · 6 of 6 slices shown]
[im 1/6]
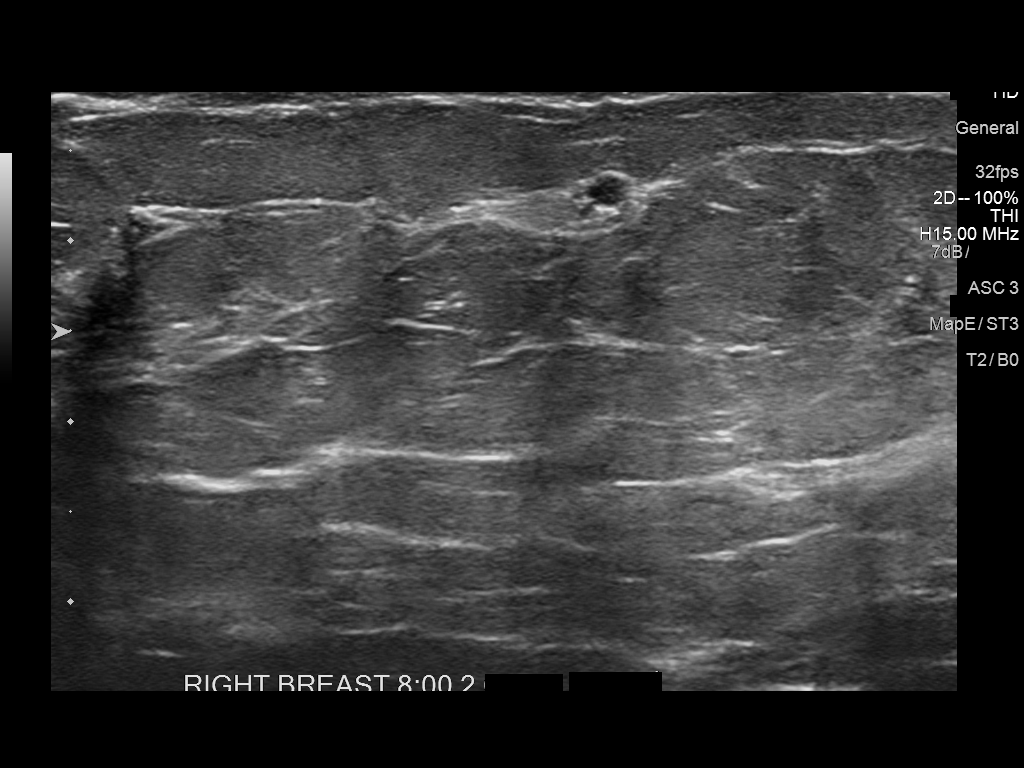
[im 2/6]
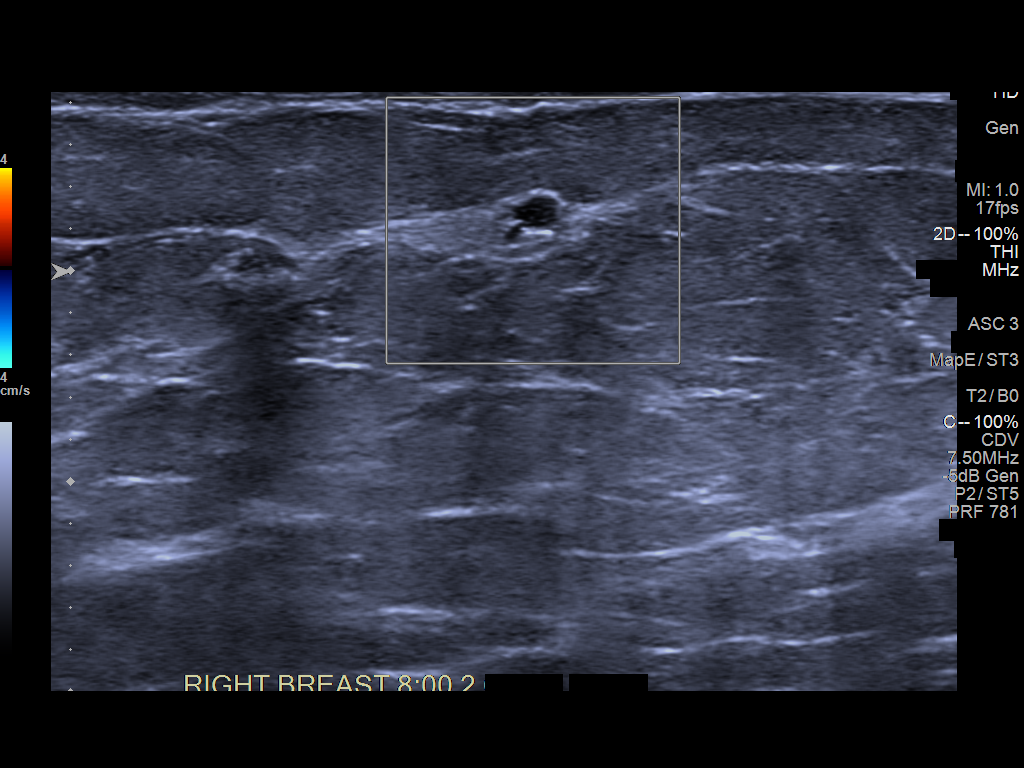
[im 3/6]
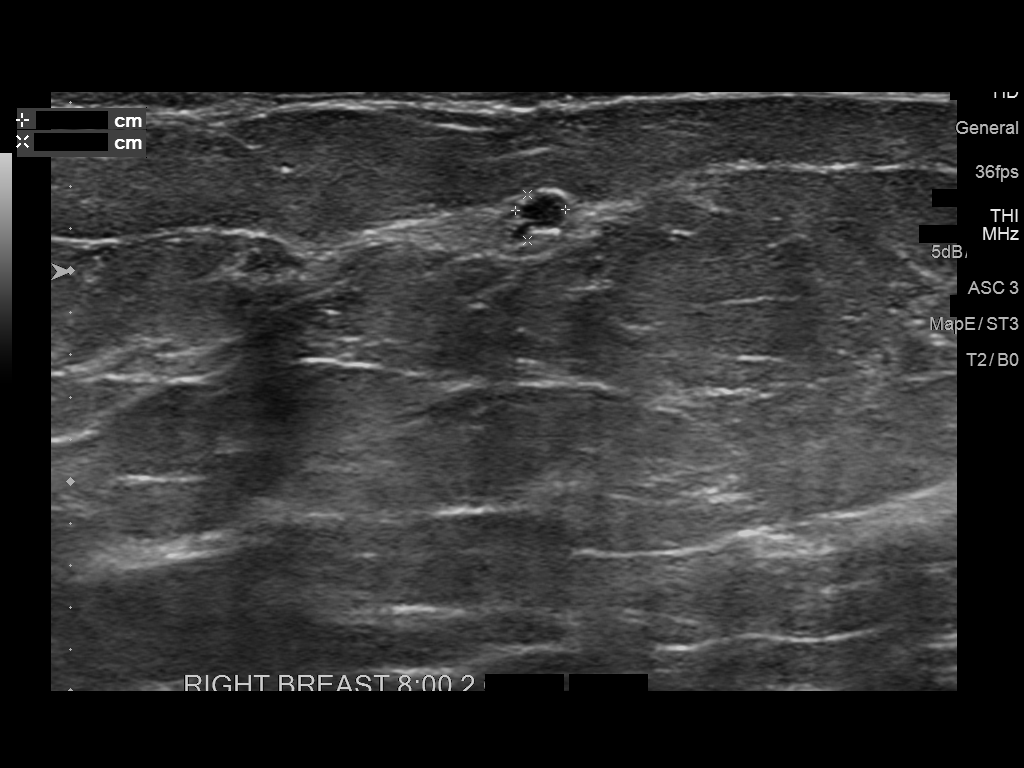
[im 4/6]
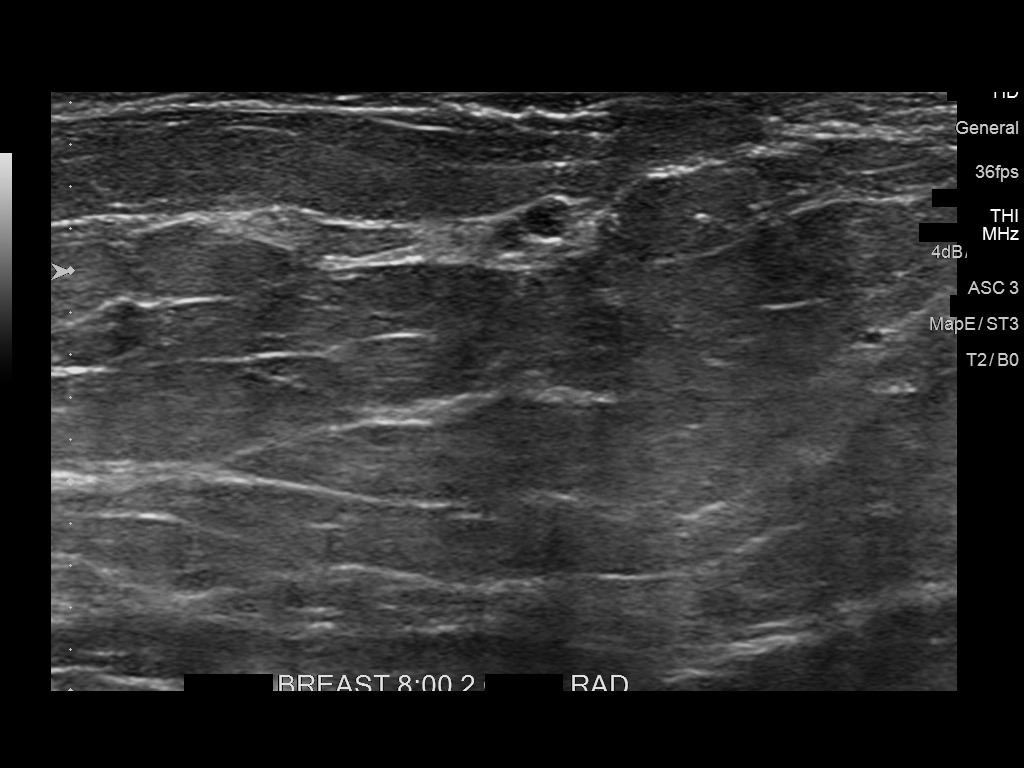
[im 5/6]
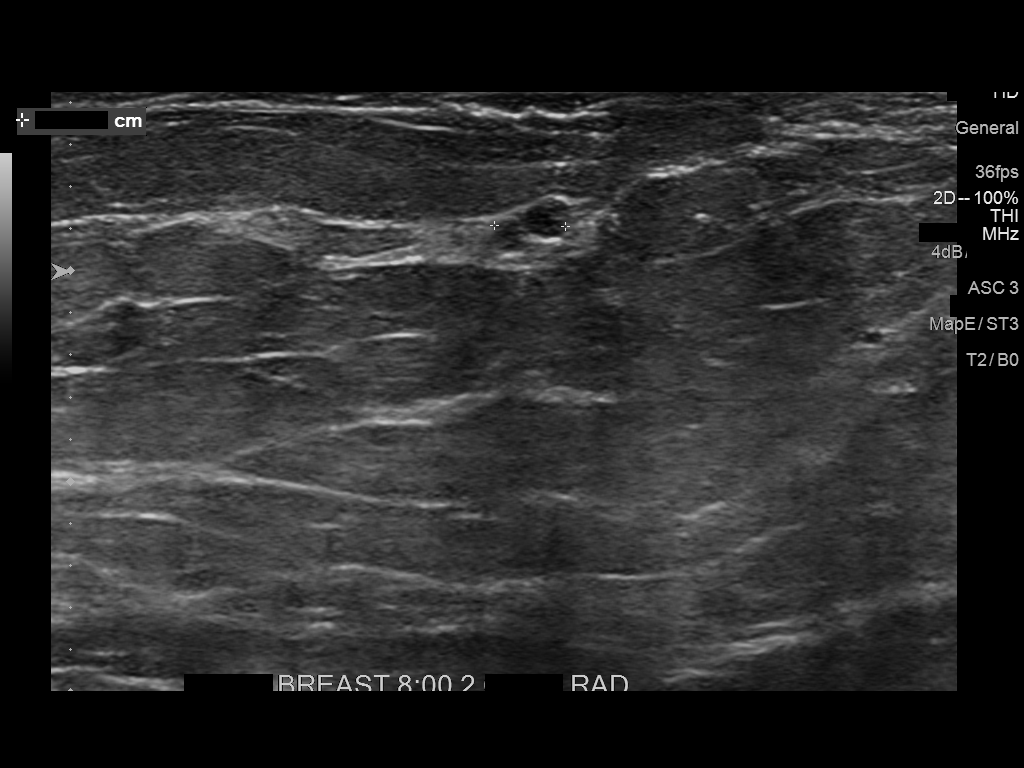
[im 6/6]
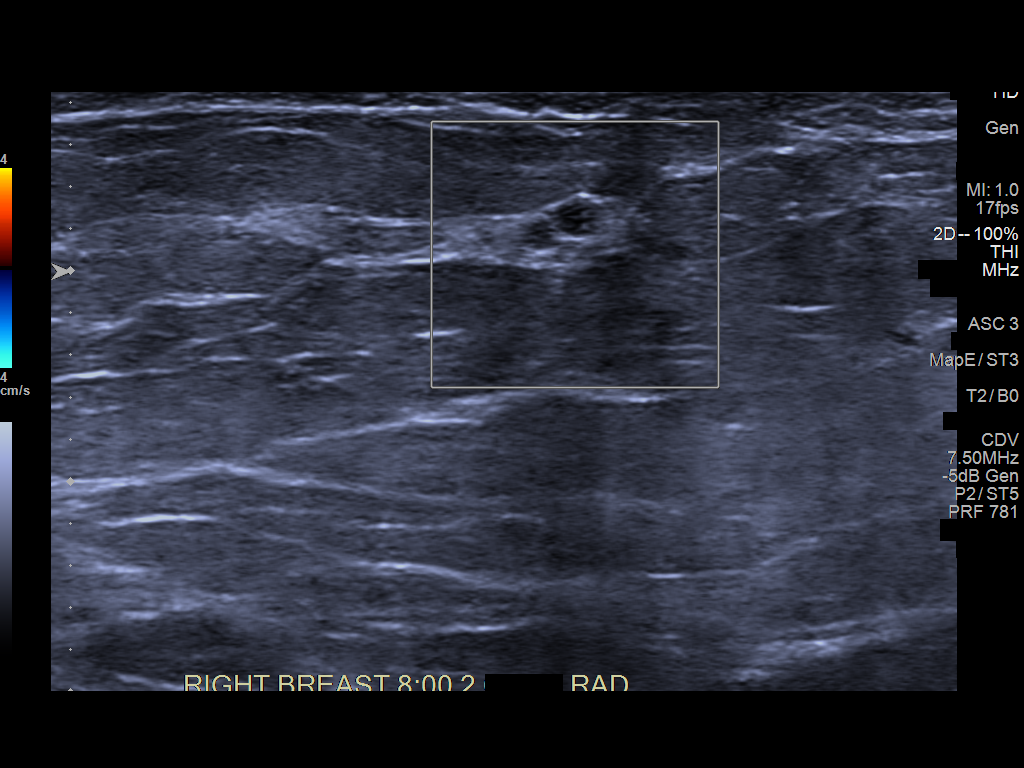

[6 of 6 positions shown; findings below may reference images not displayed]

ACR Breast Density Category b: There are scattered areas of
fibroglandular density.
FINDINGS: 2D and 3D true lateral and spot compression craniocaudal views of
the right breast were obtained. There is an approximately 6 mm
questionable persistent rounded mass in the outer aspect of the
breast in the middle depth on these views.

Mammographic images were processed with CAD.

On physical exam, no mass is palpable in the outer right breast.

Targeted ultrasound is performed, showing a 3 x 2 x 2 mm cluster
microcysts in the 8 o'clock position of the breast, 2 cm from the
nipple, corresponding to the mammographic mass.
IMPRESSION: Small cluster of benign microcysts in the 8 o'clock position of the
right breast. No evidence of malignancy.

RECOMMENDATION:
Bilateral screening mammogram in 1 year.

I have discussed the findings and recommendations with the patient.
Results were also provided in writing at the conclusion of the
visit. If applicable, a reminder letter will be sent to the patient
regarding the next appointment.

BI-RADS CATEGORY  2: Benign.

## 2019-06-07 ENCOUNTER — Ambulatory Visit
Admission: RE | Admit: 2019-06-07 | Discharge: 2019-06-07 | Disposition: A | Discharge status: Home / Self Care | Payer: Medicare Other | Source: Ambulatory Visit | Attending: Primary Care | Admitting: Primary Care

## 2019-06-07 DIAGNOSIS — Z1231 Encounter for screening mammogram for malignant neoplasm of breast: Secondary | ICD-10-CM | POA: Diagnosis not present

## 2019-07-06 ENCOUNTER — Ambulatory Visit: Payer: Medicare Other

## 2019-07-09 ENCOUNTER — Encounter: Payer: Medicare Other | Admitting: Primary Care

## 2019-07-16 ENCOUNTER — Other Ambulatory Visit: Payer: Self-pay | Admitting: Primary Care

## 2019-07-16 DIAGNOSIS — I10 Essential (primary) hypertension: Secondary | ICD-10-CM

## 2019-07-16 DIAGNOSIS — R7303 Prediabetes: Secondary | ICD-10-CM

## 2019-07-20 ENCOUNTER — Telehealth: Payer: Self-pay

## 2019-07-20 NOTE — Telephone Encounter (Signed)
LVM to call clinic, needs COVID screen and back door lab info

## 2019-07-22 ENCOUNTER — Other Ambulatory Visit: Payer: Self-pay

## 2019-07-22 ENCOUNTER — Other Ambulatory Visit (INDEPENDENT_AMBULATORY_CARE_PROVIDER_SITE_OTHER): Payer: Medicare Other

## 2019-07-22 ENCOUNTER — Other Ambulatory Visit: Payer: Medicare Other

## 2019-07-22 DIAGNOSIS — R7303 Prediabetes: Secondary | ICD-10-CM | POA: Diagnosis not present

## 2019-07-22 DIAGNOSIS — I10 Essential (primary) hypertension: Secondary | ICD-10-CM

## 2019-07-22 LAB — COMPREHENSIVE METABOLIC PANEL
ALT: 25 U/L (ref 0–35)
AST: 20 U/L (ref 0–37)
Albumin: 4.5 g/dL (ref 3.5–5.2)
Alkaline Phosphatase: 111 U/L (ref 39–117)
BUN: 9 mg/dL (ref 6–23)
CO2: 30 mEq/L (ref 19–32)
Calcium: 9.2 mg/dL (ref 8.4–10.5)
Chloride: 101 mEq/L (ref 96–112)
Creatinine, Ser: 0.73 mg/dL (ref 0.40–1.20)
GFR: 78.13 mL/min (ref >60.00)
Glucose, Bld: 134 mg/dL — ABNORMAL HIGH (ref 70–99)
Potassium: 3.5 mEq/L (ref 3.5–5.1)
Sodium: 139 mEq/L (ref 135–145)
Total Bilirubin: 0.5 mg/dL (ref 0.2–1.2)
Total Protein: 7.4 g/dL (ref 6.0–8.3)

## 2019-07-22 LAB — CBC
HCT: 40.9 % (ref 36.0–46.0)
Hemoglobin: 14 g/dL (ref 12.0–15.0)
MCHC: 34.2 g/dL (ref 30.0–36.0)
MCV: 92.7 fl (ref 78.0–100.0)
Platelets: 288 10*3/uL (ref 150.0–400.0)
RBC: 4.42 Mil/uL (ref 3.87–5.11)
RDW: 12.7 % (ref 11.5–15.5)
WBC: 10.7 10*3/uL — ABNORMAL HIGH (ref 4.0–10.5)

## 2019-07-22 LAB — LIPID PANEL
Cholesterol: 187 mg/dL (ref 0–200)
HDL: 53.4 mg/dL (ref >39.00)
LDL Cholesterol: 112 mg/dL — ABNORMAL HIGH (ref 0–99)
NonHDL: 133.51
Total CHOL/HDL Ratio: 4
Triglycerides: 110 mg/dL (ref 0.0–149.0)
VLDL: 22 mg/dL (ref 0.0–40.0)

## 2019-07-22 LAB — HEMOGLOBIN A1C: Hgb A1c MFr Bld: 6.4 % (ref 4.6–6.5)

## 2019-07-26 ENCOUNTER — Ambulatory Visit (INDEPENDENT_AMBULATORY_CARE_PROVIDER_SITE_OTHER): Payer: Medicare Other

## 2019-07-26 DIAGNOSIS — Z0000 Encounter for general adult medical examination without abnormal findings: Secondary | ICD-10-CM

## 2019-07-26 NOTE — Progress Notes (Signed)
PCP notes:  Health Maintenance: Needs Cologuard. Declined flu and shingrix vaccines.     Abnormal Screenings: none    Patient concerns: none    Nurse concerns: none    Next PCP appt.: 07/28/2019 @ 7:40 am

## 2019-07-26 NOTE — Patient Instructions (Signed)
Kim Holloway , Thank you for taking time to come for your Medicare Wellness Visit. I appreciate your ongoing commitment to your health goals. Please review the following plan we discussed and let me know if I can assist you in the future.   Screening recommendations/referrals: Colonoscopy: will do Cologuard Mammogram: up to date, completed 06/07/2019 Bone Density: up to date, completed 08/04/2017 Recommended yearly ophthalmology/optometry visit for glaucoma screening and checkup Recommended yearly dental visit for hygiene and checkup  Vaccinations: Influenza vaccine: declined Pneumococcal vaccine: Completed series Tdap vaccine: up to date, completed 07/07/2013 Shingles vaccine: declined    Advanced directives: Advance directive discussed with you today. Even though you declined this today please call our office should you change your mind and we can give you the proper paperwork for you to fill out.  Conditions/risks identified: hypertension  Next appointment: 07/28/2019 @ 7:40 am    Preventive Care 65 Years and Older, Female Preventive care refers to lifestyle choices and visits with your health care provider that can promote health and wellness. What does preventive care include?  A yearly physical exam. This is also called an annual well check.  Dental exams once or twice a year.  Routine eye exams. Ask your health care provider how often you should have your eyes checked.  Personal lifestyle choices, including:  Daily care of your teeth and gums.  Regular physical activity.  Eating a healthy diet.  Avoiding tobacco and drug use.  Limiting alcohol use.  Practicing safe sex.  Taking low-dose aspirin every day.  Taking vitamin and mineral supplements as recommended by your health care provider. What happens during an annual well check? The services and screenings done by your health care provider during your annual well check will depend on your age, overall health,  lifestyle risk factors, and family history of disease. Counseling  Your health care provider may ask you questions about your:  Alcohol use.  Tobacco use.  Drug use.  Emotional well-being.  Home and relationship well-being.  Sexual activity.  Eating habits.  History of falls.  Memory and ability to understand (cognition).  Work and work Statistician.  Reproductive health. Screening  You may have the following tests or measurements:  Height, weight, and BMI.  Blood pressure.  Lipid and cholesterol levels. These may be checked every 5 years, or more frequently if you are over 38 years old.  Skin check.  Lung cancer screening. You may have this screening every year starting at age 74 if you have a 30-pack-year history of smoking and currently smoke or have quit within the past 15 years.  Fecal occult blood test (FOBT) of the stool. You may have this test every year starting at age 82.  Flexible sigmoidoscopy or colonoscopy. You may have a sigmoidoscopy every 5 years or a colonoscopy every 10 years starting at age 4.  Hepatitis C blood test.  Hepatitis B blood test.  Sexually transmitted disease (STD) testing.  Diabetes screening. This is done by checking your blood sugar (glucose) after you have not eaten for a while (fasting). You may have this done every 1-3 years.  Bone density scan. This is done to screen for osteoporosis. You may have this done starting at age 56.  Mammogram. This may be done every 1-2 years. Talk to your health care provider about how often you should have regular mammograms. Talk with your health care provider about your test results, treatment options, and if necessary, the need for more tests. Vaccines  Your  health care provider may recommend certain vaccines, such as:  Influenza vaccine. This is recommended every year.  Tetanus, diphtheria, and acellular pertussis (Tdap, Td) vaccine. You may need a Td booster every 10 years.  Zoster  vaccine. You may need this after age 62.  Pneumococcal 13-valent conjugate (PCV13) vaccine. One dose is recommended after age 56.  Pneumococcal polysaccharide (PPSV23) vaccine. One dose is recommended after age 68. Talk to your health care provider about which screenings and vaccines you need and how often you need them. This information is not intended to replace advice given to you by your health care provider. Make sure you discuss any questions you have with your health care provider. Document Released: 10/20/2015 Document Revised: 06/12/2016 Document Reviewed: 07/25/2015 Elsevier Interactive Patient Education  2017 [place] Prevention in the Home Falls can cause injuries. They can happen to people of all ages. There are many things you can do to make your home safe and to help prevent falls. What can I do on the outside of my home?  Regularly fix the edges of walkways and driveways and fix any cracks.  Remove anything that might make you trip as you walk through a door, such as a raised step or threshold.  Trim any bushes or trees on the path to your home.  Use bright outdoor lighting.  Clear any walking paths of anything that might make someone trip, such as rocks or tools.  Regularly check to see if handrails are loose or broken. Make sure that both sides of any steps have handrails.  Any raised decks and porches should have guardrails on the edges.  Have any leaves, snow, or ice cleared regularly.  Use sand or salt on walking paths during winter.  Clean up any spills in your garage right away. This includes oil or grease spills. What can I do in the bathroom?  Use night lights.  Install grab bars by the toilet and in the tub and shower. Do not use towel bars as grab bars.  Use non-skid mats or decals in the tub or shower.  If you need to sit down in the shower, use a plastic, non-slip stool.  Keep the floor dry. Clean up any water that spills on the  floor as soon as it happens.  Remove soap buildup in the tub or shower regularly.  Attach bath mats securely with double-sided non-slip rug tape.  Do not have throw rugs and other things on the floor that can make you trip. What can I do in the bedroom?  Use night lights.  Make sure that you have a light by your bed that is easy to reach.  Do not use any sheets or blankets that are too big for your bed. They should not hang down onto the floor.  Have a firm chair that has side arms. You can use this for support while you get dressed.  Do not have throw rugs and other things on the floor that can make you trip. What can I do in the kitchen?  Clean up any spills right away.  Avoid walking on wet floors.  Keep items that you use a lot in easy-to-reach places.  If you need to reach something above you, use a strong step stool that has a grab bar.  Keep electrical cords out of the way.  Do not use floor polish or wax that makes floors slippery. If you must use wax, use non-skid floor wax.  Do not  have throw rugs and other things on the floor that can make you trip. What can I do with my stairs?  Do not leave any items on the stairs.  Make sure that there are handrails on both sides of the stairs and use them. Fix handrails that are broken or loose. Make sure that handrails are as long as the stairways.  Check any carpeting to make sure that it is firmly attached to the stairs. Fix any carpet that is loose or worn.  Avoid having throw rugs at the top or bottom of the stairs. If you do have throw rugs, attach them to the floor with carpet tape.  Make sure that you have a light switch at the top of the stairs and the bottom of the stairs. If you do not have them, ask someone to add them for you. What else can I do to help prevent falls?  Wear shoes that:  Do not have high heels.  Have rubber bottoms.  Are comfortable and fit you well.  Are closed at the toe. Do not wear  sandals.  If you use a stepladder:  Make sure that it is fully opened. Do not climb a closed stepladder.  Make sure that both sides of the stepladder are locked into place.  Ask someone to hold it for you, if possible.  Clearly mark and make sure that you can see:  Any grab bars or handrails.  First and last steps.  Where the edge of each step is.  Use tools that help you move around (mobility aids) if they are needed. These include:  Canes.  Walkers.  Scooters.  Crutches.  Turn on the lights when you go into a dark area. Replace any light bulbs as soon as they burn out.  Set up your furniture so you have a clear path. Avoid moving your furniture around.  If any of your floors are uneven, fix them.  If there are any pets around you, be aware of where they are.  Review your medicines with your doctor. Some medicines can make you feel dizzy. This can increase your chance of falling. Ask your doctor what other things that you can do to help prevent falls. This information is not intended to replace advice given to you by your health care provider. Make sure you discuss any questions you have with your health care provider. Document Released: 07/20/2009 Document Revised: 02/29/2016 Document Reviewed: 10/28/2014 Elsevier Interactive Patient Education  2017 [employer].

## 2019-07-26 NOTE — Progress Notes (Signed)
Subjective:   Kim Holloway is a 73 y.o. female who presents for Medicare Annual (Subsequent) preventive examination.  Review of Systems:    This visit is being conducted through telemedicine via telephone at the nurse health advisor's home address due to the COVID-19 pandemic. This patient has given me verbal consent via doximity to conduct this visit, patient states they are participating from their home address. Patient and myself are on the telephone call. There is no referral for this visit. Some vital signs may be absent or patient reported.    Patient identification: identified by name, DOB, and current address   Cardiac Risk Factors include: advanced age (>69men, >41 women);hypertension     Objective:     Vitals: There were no vitals taken for this visit.  There is no height or weight on file to calculate BMI.  Advanced Directives 07/26/2019 06/26/2018 07/31/2017 07/21/2017 06/23/2017  Does Patient Have a Medical Advance Directive? No No No No Yes  Type of Advance Directive - - - - Press photographer;Living will  Does patient want to make changes to medical advance directive? - - - - Yes (MAU/Ambulatory/Procedural Areas - Information given)  Copy of [place] in Chart? - - - - No - copy requested  Would patient like information on creating a medical advance directive? No - Patient declined No - Patient declined No - Patient declined - -    Tobacco Social History   Tobacco Use  Smoking Status Never Smoker  Smokeless Tobacco Never Used     Counseling given: Not Answered   Clinical Intake:  Pre-visit preparation completed: Yes  Pain : No/denies pain     Nutritional Risks: None Diabetes: No  How often do you need to have someone help you when you read instructions, pamphlets, or other written materials from your doctor or pharmacy?: 1 - Never What is the last grade level you completed in school?: 10th  Interpreter Needed?: No   Information entered by :: CJohnson, LPN  Past Medical History:  Diagnosis Date  . Abnormal breast biopsy 10/04/2013   right breast  . Family history of adverse reaction to anesthesia 2016   brother had difficulty waking up after surgery  . History of colonoscopy 2008  . Hypertension   . Mammographic calcification 06/2017   Past Surgical History:  Procedure Laterality Date  . BREAST BIOPSY Right 2014   stereo bx benign calcs  . BREAST BIOPSY Left ?   cyst  . CATARACT EXTRACTION  01/19/2016, 02/19/2016  . COLONOSCOPY  2008  . CYST EXCISION Right 07/31/2017   Procedure: CYST REMOVAL RIGHT INDEX FINGER;  Surgeon: Hessie Knows, MD;  Location: [facility] ORS;  Service: Orthopedics;  Laterality: Right;  . EYE SURGERY Left 12/2016   laser surgery   Family History  Problem Relation Age of Onset  . Breast cancer Neg Hx    Social History   Socioeconomic History  . Marital status: Married    Spouse name: Not on file  . Number of children: Not on file  . Years of education: Not on file  . Highest education level: Not on file  Occupational History  . Not on file  Social Needs  . Financial resource strain: Not hard at all  . Food insecurity    Worry: Never true    Inability: Never true  . Transportation needs    Medical: No    Non-medical: No  Tobacco Use  . Smoking status: Never Smoker  .  Smokeless tobacco: Never Used  Substance and Sexual Activity  . Alcohol use: No  . Drug use: No  . Sexual activity: Not on file  Lifestyle  . Physical activity    Days per week: 0 days    Minutes per session: 0 min  . Stress: Not at all  Relationships  . Social Herbalist on phone: Not on file    Gets together: Not on file    Attends religious service: Not on file    Active member of club or organization: Not on file    Attends meetings of clubs or organizations: Not on file    Relationship status: Not on file  Other Topics Concern  . Not on file  Social History Narrative    Married.   Moved from [place]. Originally from [place].   Retired.       Outpatient Encounter Medications as of 07/26/2019  Medication Sig  . aspirin EC 81 MG tablet Take 81 mg by mouth daily.  . Calcium Carb-Cholecalciferol (CALCIUM 600 + D PO) Take 2 tablets by mouth daily.  . cholecalciferol (VITAMIN D) 1000 units tablet Take 1,000 Units by mouth daily.  . irbesartan-hydrochlorothiazide (AVALIDE) 150-12.5 MG tablet TAKE 1 TABLET BY MOUTH 1  TIME DAILY FOR HIGH BLOOD  PRESSURE.   No facility-administered encounter medications on file as of 07/26/2019.     Activities of Daily Living In your present state of health, do you have any difficulty performing the following activities: 07/26/2019  Hearing? N  Vision? N  Difficulty concentrating or making decisions? N  Walking or climbing stairs? N  Dressing or bathing? N  Doing errands, shopping? N  Preparing Food and eating ? N  Using the Toilet? N  In the past six months, have you accidently leaked urine? N  Do you have problems with loss of bowel control? N  Managing your Medications? N  Managing your Finances? N  Housekeeping or managing your Housekeeping? N  Some recent data might be hidden    Patient Care Team: Pleas Koch, NP as PCP - General (Internal Medicine)    Assessment:   This is a routine wellness examination for Kim Holloway.  Exercise Activities and Dietary recommendations Current Exercise Habits: Home exercise routine, Type of exercise: walking, Time (Minutes): 60, Frequency (Times/Week): 7, Weekly Exercise (Minutes/Week): 420, Intensity: Moderate, Exercise limited by: None identified  Goals    . Increase physical activity     Starting 06/26/2018, I will continue to exercise for 60 minutes 7 days per week.     . Patient Stated     07/26/2019, I will maintain and continue medications as prescribed.        Fall Risk Fall Risk  07/26/2019 06/28/2018 06/23/2017  Falls in the past year? 0 No No  Number falls  in past yr: 0 - -  Comment none - -  Injury with Fall? 0 - -  Risk for fall due to : Medication side effect - -  Follow up Falls evaluation completed;Falls prevention discussed - -   Is the patient's home free of loose throw rugs in walkways, pet beds, electrical cords, etc?   yes      Grab bars in the bathroom? yes      Handrails on the stairs?   yes      Adequate lighting?   yes  Timed Get Up and Go performed: N/A  Depression Screen PHQ 2/9 Scores 07/26/2019 06/28/2018 06/23/2017  PHQ - 2  Score 0 0 0  PHQ- 9 Score 0 0 1     Cognitive Function MMSE - Mini Mental State Exam 07/26/2019 06/28/2018 06/23/2017  Orientation to time 5 5 5   Orientation to Place 5 5 5   Registration 3 3 3   Attention/ Calculation 5 0 0  Recall 3 3 3   Language- name 2 objects - 0 0  Language- repeat 1 1 1   Language- follow 3 step command - 3 3  Language- read & follow direction - 0 0  Write a sentence - 0 0  Copy design - 0 0  Total score - 20 20  Mini Cog  Mini-Cog screen was completed. Maximum score is 22. A value of 0 denotes this part of the MMSE was not completed or the patient failed this part of the Mini-Cog screening.      Immunization History  Administered Date(s) Administered  . Pneumococcal Conjugate-13 11/30/2014  . Pneumococcal Polysaccharide-23 07/07/2013  . Tdap 07/07/2013  . Zoster 01/01/2016    Qualifies for Shingles Vaccine? Yes   Screening Tests Health Maintenance  Topic Date Due  . Fecal DNA (Cologuard)  07/08/2019  . INFLUENZA VACCINE  07/06/2049 (Originally 05/08/2019)  . MAMMOGRAM  06/06/2021  . TETANUS/TDAP  07/08/2023  . DEXA SCAN  Completed  . Hepatitis C Screening  Completed  . PNA vac Low Risk Adult  Completed    Cancer Screenings: Lung: Low Dose CT Chest recommended if Age 36-80 years, 30 pack-year currently smoking OR have quit w/in 15years. Patient does not qualify. Breast:  Up to date on Mammogram? Yes, completed 06/07/2019   Up to date of Bone  Density/Dexa? Yes, completed 08/04/2017 Colorectal: Will do Cologuard   Additional Screenings:  Hepatitis C Screening: 06/23/2017     Plan:    Patient will maintain and take medications as prescribed.    I have personally reviewed and noted the following in the patient's chart:   . Medical and social history . Use of alcohol, tobacco or illicit drugs  . Current medications and supplements . Functional ability and status . Nutritional status . Physical activity . Advanced directives . List of other physicians . Hospitalizations, surgeries, and ER visits in previous 12 months . Vitals . Screenings to include cognitive, depression, and falls . Referrals and appointments  In addition, I have reviewed and discussed with patient certain preventive protocols, quality metrics, and best practice recommendations. A written personalized care plan for preventive services as well as general preventive health recommendations were provided to patient.     Andrez Grime, LPN  [id number]

## 2019-07-28 ENCOUNTER — Other Ambulatory Visit: Payer: Self-pay

## 2019-07-28 ENCOUNTER — Encounter: Payer: Self-pay | Admitting: Primary Care

## 2019-07-28 ENCOUNTER — Ambulatory Visit (INDEPENDENT_AMBULATORY_CARE_PROVIDER_SITE_OTHER): Payer: Medicare Other | Admitting: Primary Care

## 2019-07-28 VITALS — BP 126/84 | HR 73 | Temp 98.5°F | Ht 64.5 in | Wt 160.0 lb

## 2019-07-28 DIAGNOSIS — R7303 Prediabetes: Secondary | ICD-10-CM | POA: Diagnosis not present

## 2019-07-28 DIAGNOSIS — I10 Essential (primary) hypertension: Secondary | ICD-10-CM

## 2019-07-28 DIAGNOSIS — E2839 Other primary ovarian failure: Secondary | ICD-10-CM

## 2019-07-28 DIAGNOSIS — Z1211 Encounter for screening for malignant neoplasm of colon: Secondary | ICD-10-CM | POA: Diagnosis not present

## 2019-07-28 DIAGNOSIS — H409 Unspecified glaucoma: Secondary | ICD-10-CM | POA: Diagnosis not present

## 2019-07-28 NOTE — Patient Instructions (Signed)
Continue exercising. You should be getting 150 minutes of moderate intensity exercise weekly.  It is important that you improve your diet. Please limit carbohydrates in the form of white bread, rice, pasta, sweets, fast food, fried food, sugary drinks, etc. Increase your consumption of fresh fruits and vegetables, whole grains, lean protein.  Ensure you are consuming 64 ounces of water daily.  Call the [facility] to schedule your bone density scan.  Complete the Cologuard Box once received.  Schedule a lab only appointment for blood sugar and cholesterol check.  It was a pleasure to see you today!

## 2019-07-28 NOTE — Progress Notes (Signed)
Subjective:    Patient ID: Kim Holloway, female    DOB: [birth year], 73 y.o.   MRN: [id number]  HPI  Ms. Hosp is a 73 year old female who presents today for [place] Part 2. She saw our health advisor this week.  BP Readings from Last 3 Encounters:  07/28/19 126/84  06/30/18 124/84  06/26/18 118/88    Immunizations: -Tetanus: Completed in 2014 -Influenza: Declines -Pneumonia: Completed both courses  -Shingles: Completed in 2017  Colonoscopy: Completed colonscopy in 2008, Cologuard in 2017. Mammogram: Completed in 2020 Dexa: Due, taking calcium and vitamin D Hep C Screen: Negative   She endorses a healthy diet but has been eating a lot of candy recently. She is walking several miles several days weekly. Drinking hot tea, coffee, diet soda, some water.    Review of Systems  Constitutional: Negative for unexpected weight change.  HENT: Negative for rhinorrhea.   Respiratory: Negative for cough and shortness of breath.   Cardiovascular: Negative for chest pain.  Gastrointestinal: Negative for constipation and diarrhea.  Genitourinary: Negative for difficulty urinating.  Musculoskeletal: Negative for arthralgias and myalgias.  Skin: Negative for rash.  Allergic/Immunologic: Negative for environmental allergies.  Neurological: Negative for dizziness, numbness and headaches.  Psychiatric/Behavioral: The patient is not nervous/anxious.        Past Medical History:  Diagnosis Date  . Abnormal breast biopsy 10/04/2013   right breast  . Family history of adverse reaction to anesthesia 2016   brother had difficulty waking up after surgery  . History of colonoscopy 2008  . Hypertension   . Mammographic calcification 06/2017     Social History   Socioeconomic History  . Marital status: Married    Spouse name: Not on file  . Number of children: Not on file  . Years of education: Not on file  . Highest education level: Not on file  Occupational History  . Not on file   Social Needs  . Financial resource strain: Not hard at all  . Food insecurity    Worry: Never true    Inability: Never true  . Transportation needs    Medical: No    Non-medical: No  Tobacco Use  . Smoking status: Never Smoker  . Smokeless tobacco: Never Used  Substance and Sexual Activity  . Alcohol use: No  . Drug use: No  . Sexual activity: Not on file  Lifestyle  . Physical activity    Days per week: 0 days    Minutes per session: 0 min  . Stress: Not at all  Relationships  . Social Herbalist on phone: Not on file    Gets together: Not on file    Attends religious service: Not on file    Active member of club or organization: Not on file    Attends meetings of clubs or organizations: Not on file    Relationship status: Not on file  . Intimate partner violence    Fear of current or ex partner: No    Emotionally abused: No    Physically abused: No    Forced sexual activity: No  Other Topics Concern  . Not on file  Social History Narrative   Married.   Moved from [place]. Originally from [place].   Retired.       Past Surgical History:  Procedure Laterality Date  . BREAST BIOPSY Right 2014   stereo bx benign calcs  . BREAST BIOPSY Left ?   cyst  . CATARACT  EXTRACTION  01/19/2016, 02/19/2016  . COLONOSCOPY  2008  . CYST EXCISION Right 07/31/2017   Procedure: CYST REMOVAL RIGHT INDEX FINGER;  Surgeon: Hessie Knows, MD;  Location: [facility] ORS;  Service: Orthopedics;  Laterality: Right;  . EYE SURGERY Left 12/2016   laser surgery    Family History  Problem Relation Age of Onset  . Breast cancer Neg Hx     No Known Allergies  Current Outpatient Medications on File Prior to Visit  Medication Sig Dispense Refill  . aspirin EC 81 MG tablet Take 81 mg by mouth daily.    . Calcium Carb-Cholecalciferol (CALCIUM 600 + D PO) Take 2 tablets by mouth daily.    . cholecalciferol (VITAMIN D) 1000 units tablet Take 1,000 Units by mouth daily.    .  irbesartan-hydrochlorothiazide (AVALIDE) 150-12.5 MG tablet TAKE 1 TABLET BY MOUTH 1  TIME DAILY FOR HIGH BLOOD  PRESSURE. 90 tablet 3   No current facility-administered medications on file prior to visit.     BP 126/84   Pulse 73   Temp 98.5 F (36.9 C) (Temporal)   Ht 5' 4.5" (1.638 m)   Wt 160 lb (72.6 kg)   SpO2 97%   BMI 27.04 kg/m    Objective:   Physical Exam  Constitutional: She is oriented to person, place, and time. She appears well-nourished.  HENT:  Right Ear: Tympanic membrane and ear canal normal.  Left Ear: Tympanic membrane and ear canal normal.  Mouth/Throat: Oropharynx is clear and moist.  Eyes: Pupils are equal, round, and reactive to light. EOM are normal.  Neck: Neck supple.  Cardiovascular: Normal rate and regular rhythm.  Respiratory: Effort normal and breath sounds normal.  GI: Soft. Bowel sounds are normal. There is no abdominal tenderness.  Musculoskeletal: Normal range of motion.  Neurological: She is alert and oriented to person, place, and time.  Skin: Skin is warm and dry.  Psychiatric: She has a normal mood and affect.           Assessment & Plan:  Healthcare Maintenance:   Mammogram UTD. Colon cancer screening due, Cologuard ordered. Bone density scan due, ordered. Encouraged a healthy diet and regular exercise. Hep C screening UTD, negative.

## 2019-07-28 NOTE — Assessment & Plan Note (Signed)
Chronic, worse than last year and very close to a diagnosis of diabetes.  Discussed importance of a healthy diet with regular exercise. Reduce candy and sweets.   Repeat in 4 months.

## 2019-07-28 NOTE — Assessment & Plan Note (Signed)
Stable in the office today. BMP reviewed. Continue current regimen.

## 2019-07-28 NOTE — Assessment & Plan Note (Signed)
Following with ophthalmology. 

## 2019-08-16 ENCOUNTER — Ambulatory Visit
Admission: RE | Admit: 2019-08-16 | Discharge: 2019-08-16 | Disposition: A | Discharge status: Home / Self Care | Payer: Medicare Other | Source: Ambulatory Visit | Attending: Primary Care | Admitting: Primary Care

## 2019-08-16 DIAGNOSIS — E2839 Other primary ovarian failure: Secondary | ICD-10-CM | POA: Insufficient documentation

## 2019-08-16 DIAGNOSIS — Z780 Asymptomatic menopausal state: Secondary | ICD-10-CM | POA: Diagnosis not present

## 2019-08-16 DIAGNOSIS — M85851 Other specified disorders of bone density and structure, right thigh: Secondary | ICD-10-CM | POA: Diagnosis not present

## 2019-08-16 DIAGNOSIS — Z13820 Encounter for screening for osteoporosis: Secondary | ICD-10-CM | POA: Diagnosis not present

## 2019-10-12 ENCOUNTER — Other Ambulatory Visit: Payer: Self-pay | Admitting: Primary Care

## 2019-10-12 DIAGNOSIS — I10 Essential (primary) hypertension: Secondary | ICD-10-CM

## 2019-10-21 ENCOUNTER — Other Ambulatory Visit: Payer: Self-pay | Admitting: Primary Care

## 2019-10-27 ENCOUNTER — Ambulatory Visit: Payer: Medicare Other | Attending: Internal Medicine

## 2019-10-27 DIAGNOSIS — Z23 Encounter for immunization: Secondary | ICD-10-CM

## 2019-10-27 NOTE — Progress Notes (Signed)
   Covid-19 Vaccination Clinic  Name:  Kim Holloway    MRN: [id number] DOB: [birth year]  10/27/2019  [name] was observed post Covid-19 immunization for 15 minutes without incidence. She was provided with Vaccine Information Sheet and instruction to access the V-Safe system.   [name] was instructed to call 911 with any severe reactions post vaccine: Marland Kitchen Difficulty breathing  . Swelling of your face and throat  . A fast heartbeat  . A bad rash all over your body  . Dizziness and weakness    Immunizations Administered    Name Date Dose VIS Date Route   Pfizer COVID-19 Vaccine 10/27/2019  6:28 PM 0.3 mL 09/17/2019 Intramuscular   Manufacturer: [place]   Lot: [id number]   [place]: [id number]

## 2019-11-17 ENCOUNTER — Ambulatory Visit: Payer: Medicare Other | Attending: Internal Medicine

## 2019-11-17 ENCOUNTER — Other Ambulatory Visit: Payer: Self-pay | Admitting: Primary Care

## 2019-11-17 DIAGNOSIS — Z23 Encounter for immunization: Secondary | ICD-10-CM

## 2019-11-17 DIAGNOSIS — R7303 Prediabetes: Secondary | ICD-10-CM

## 2019-11-17 DIAGNOSIS — E785 Hyperlipidemia, unspecified: Secondary | ICD-10-CM

## 2019-11-17 NOTE — Progress Notes (Signed)
   Covid-19 Vaccination Clinic  Name:  Kim Holloway    MRN: [id number] DOB: [birth year]  11/17/2019  Kim Holloway was observed post Covid-19 immunization for 15 minutes without incidence. She was provided with Vaccine Information Sheet and instruction to access the V-Safe system.   Kim Holloway was instructed to call 911 with any severe reactions post vaccine: Marland Kitchen Difficulty breathing  . Swelling of your face and throat  . A fast heartbeat  . A bad rash all over your body  . Dizziness and weakness    Immunizations Administered    Name Date Dose VIS Date Route   Pfizer COVID-19 Vaccine 11/17/2019  9:22 AM 0.3 mL 09/17/2019 Intramuscular   Manufacturer: [place]   Lot: [id number]   [place]: [id number]

## 2019-11-29 ENCOUNTER — Other Ambulatory Visit (INDEPENDENT_AMBULATORY_CARE_PROVIDER_SITE_OTHER): Payer: Medicare Other

## 2019-11-29 ENCOUNTER — Other Ambulatory Visit: Payer: Self-pay

## 2019-11-29 DIAGNOSIS — R7303 Prediabetes: Secondary | ICD-10-CM | POA: Diagnosis not present

## 2019-11-29 DIAGNOSIS — E785 Hyperlipidemia, unspecified: Secondary | ICD-10-CM

## 2019-11-29 LAB — HEMOGLOBIN A1C: Hgb A1c MFr Bld: 6.3 % (ref 4.6–6.5)

## 2019-11-30 LAB — LIPID PANEL
Cholesterol: 191 mg/dL (ref 0–200)
HDL: 63.3 mg/dL (ref >39.00)
LDL Cholesterol: 109 mg/dL — ABNORMAL HIGH (ref 0–99)
NonHDL: 127.57
Total CHOL/HDL Ratio: 3
Triglycerides: 92 mg/dL (ref 0.0–149.0)
VLDL: 18.4 mg/dL (ref 0.0–40.0)

## 2019-12-01 DIAGNOSIS — E785 Hyperlipidemia, unspecified: Secondary | ICD-10-CM

## 2019-12-02 DIAGNOSIS — Z1211 Encounter for screening for malignant neoplasm of colon: Secondary | ICD-10-CM | POA: Diagnosis not present

## 2019-12-02 DIAGNOSIS — Z1212 Encounter for screening for malignant neoplasm of rectum: Secondary | ICD-10-CM | POA: Diagnosis not present

## 2019-12-02 MED ORDER — ROSUVASTATIN CALCIUM 5 MG PO TABS: 5.0000 mg | ORAL_TABLET | Freq: Every evening | ORAL | 3 refills | Status: DC

## 2019-12-07 ENCOUNTER — Encounter: Payer: Self-pay | Admitting: Primary Care

## 2019-12-07 LAB — COLOGUARD: Cologuard: NEGATIVE

## 2020-01-20 ENCOUNTER — Other Ambulatory Visit: Payer: Self-pay | Admitting: Primary Care

## 2020-01-20 DIAGNOSIS — R7303 Prediabetes: Secondary | ICD-10-CM

## 2020-02-01 ENCOUNTER — Other Ambulatory Visit (INDEPENDENT_AMBULATORY_CARE_PROVIDER_SITE_OTHER): Payer: Medicare Other

## 2020-02-01 ENCOUNTER — Other Ambulatory Visit: Payer: Self-pay

## 2020-02-01 DIAGNOSIS — R7303 Prediabetes: Secondary | ICD-10-CM | POA: Diagnosis not present

## 2020-02-01 LAB — POCT GLYCOSYLATED HEMOGLOBIN (HGB A1C): Hemoglobin A1C: 6.1 % — AB (ref 4.0–5.6)

## 2020-03-22 ENCOUNTER — Other Ambulatory Visit: Payer: Self-pay | Admitting: Primary Care

## 2020-03-22 DIAGNOSIS — I10 Essential (primary) hypertension: Secondary | ICD-10-CM

## 2020-04-26 ENCOUNTER — Other Ambulatory Visit: Payer: Self-pay | Admitting: Primary Care

## 2020-04-26 DIAGNOSIS — Z1231 Encounter for screening mammogram for malignant neoplasm of breast: Secondary | ICD-10-CM

## 2020-06-07 ENCOUNTER — Ambulatory Visit
Admission: RE | Admit: 2020-06-07 | Discharge: 2020-06-07 | Disposition: A | Discharge status: Home / Self Care | Payer: Medicare Other | Source: Ambulatory Visit | Attending: Primary Care | Admitting: Primary Care

## 2020-06-07 ENCOUNTER — Other Ambulatory Visit: Payer: Self-pay

## 2020-06-07 DIAGNOSIS — Z1231 Encounter for screening mammogram for malignant neoplasm of breast: Secondary | ICD-10-CM

## 2020-07-17 ENCOUNTER — Other Ambulatory Visit: Payer: Self-pay | Admitting: Primary Care

## 2020-07-17 DIAGNOSIS — R7303 Prediabetes: Secondary | ICD-10-CM

## 2020-07-17 DIAGNOSIS — E785 Hyperlipidemia, unspecified: Secondary | ICD-10-CM

## 2020-07-17 DIAGNOSIS — I10 Essential (primary) hypertension: Secondary | ICD-10-CM

## 2020-07-21 DIAGNOSIS — H26491 Other secondary cataract, right eye: Secondary | ICD-10-CM | POA: Diagnosis not present

## 2020-07-28 ENCOUNTER — Other Ambulatory Visit: Payer: Self-pay

## 2020-07-28 ENCOUNTER — Other Ambulatory Visit (INDEPENDENT_AMBULATORY_CARE_PROVIDER_SITE_OTHER): Payer: Medicare Other

## 2020-07-28 ENCOUNTER — Ambulatory Visit: Payer: Medicare Other

## 2020-07-28 DIAGNOSIS — E785 Hyperlipidemia, unspecified: Secondary | ICD-10-CM

## 2020-07-28 DIAGNOSIS — I10 Essential (primary) hypertension: Secondary | ICD-10-CM | POA: Diagnosis not present

## 2020-07-28 DIAGNOSIS — R7303 Prediabetes: Secondary | ICD-10-CM | POA: Diagnosis not present

## 2020-07-28 LAB — COMPREHENSIVE METABOLIC PANEL
ALT: 16 U/L (ref 0–35)
AST: 17 U/L (ref 0–37)
Albumin: 3.9 g/dL (ref 3.5–5.2)
Alkaline Phosphatase: 90 U/L (ref 39–117)
BUN: 12 mg/dL (ref 6–23)
CO2: 32 mEq/L (ref 19–32)
Calcium: 8.6 mg/dL (ref 8.4–10.5)
Chloride: 104 mEq/L (ref 96–112)
Creatinine, Ser: 0.73 mg/dL (ref 0.40–1.20)
GFR: 81.2 mL/min (ref >60.00)
Glucose, Bld: 129 mg/dL — ABNORMAL HIGH (ref 70–99)
Potassium: 3.7 mEq/L (ref 3.5–5.1)
Sodium: 141 mEq/L (ref 135–145)
Total Bilirubin: 0.4 mg/dL (ref 0.2–1.2)
Total Protein: 6.5 g/dL (ref 6.0–8.3)

## 2020-07-28 LAB — LIPID PANEL
Cholesterol: 136 mg/dL (ref 0–200)
HDL: 61.8 mg/dL (ref >39.00)
LDL Cholesterol: 59 mg/dL (ref 0–99)
NonHDL: 74.53
Total CHOL/HDL Ratio: 2
Triglycerides: 80 mg/dL (ref 0.0–149.0)
VLDL: 16 mg/dL (ref 0.0–40.0)

## 2020-07-28 LAB — HEMOGLOBIN A1C: Hgb A1c MFr Bld: 6.5 % (ref 4.6–6.5)

## 2020-07-31 ENCOUNTER — Encounter: Payer: Self-pay | Admitting: Primary Care

## 2020-07-31 ENCOUNTER — Ambulatory Visit (INDEPENDENT_AMBULATORY_CARE_PROVIDER_SITE_OTHER): Payer: Medicare Other | Admitting: Primary Care

## 2020-07-31 ENCOUNTER — Other Ambulatory Visit: Payer: Self-pay

## 2020-07-31 VITALS — BP 142/80 | HR 71 | Temp 96.9°F | Ht 64.5 in | Wt 169.5 lb

## 2020-07-31 DIAGNOSIS — Z0000 Encounter for general adult medical examination without abnormal findings: Secondary | ICD-10-CM | POA: Diagnosis not present

## 2020-07-31 DIAGNOSIS — H409 Unspecified glaucoma: Secondary | ICD-10-CM

## 2020-07-31 DIAGNOSIS — I10 Essential (primary) hypertension: Secondary | ICD-10-CM | POA: Diagnosis not present

## 2020-07-31 DIAGNOSIS — E119 Type 2 diabetes mellitus without complications: Secondary | ICD-10-CM | POA: Diagnosis not present

## 2020-07-31 NOTE — Assessment & Plan Note (Signed)
Declines influenza and Shingrix vaccinations today. Mammogram and bone density scan UTD. Colon cancer screening UTD, due in 2024. Strongly advised she work on diet and exercise given new diabetes diagnosis. She will complete advanced directives. All recommendations provided at end of visit today.  Exam today unremarkable. Labs reviewed.   I have personally reviewed and have noted: 1. The patient's medical and social history 2. Their use of alcohol, tobacco or illicit drugs 3. Their current medications and supplements 4. The patient's functional ability including ADL's, fall risks, home safety risks and  hearing or visual impairment. 5. Diet and physical activities 6. Evidence for depression or mood disorder

## 2020-07-31 NOTE — Progress Notes (Signed)
Patient ID: Kim Holloway, female   DOB: Feb 11, 1946, 74 y.o.   MRN: [id number]  [name] is a 74 year old female who presents today for [place]. HPI:  Past Medical History:  Diagnosis Date  . Abnormal breast biopsy 10/04/2013   right breast  . Family history of adverse reaction to anesthesia 2016   brother had difficulty waking up after surgery  . History of colonoscopy 2008  . Hypertension   . Mammographic calcification 06/2017    Current Outpatient Medications  Medication Sig Dispense Refill  . aspirin EC 81 MG tablet Take 81 mg by mouth daily.    . Calcium Carb-Cholecalciferol (CALCIUM 600 + D PO) Take 2 tablets by mouth daily.    . cholecalciferol (VITAMIN D) 1000 units tablet Take 1,000 Units by mouth daily.    . irbesartan-hydrochlorothiazide (AVALIDE) 150-12.5 MG tablet TAKE 1 TABLET BY MOUTH ONCE DAILY FOR HIGH BLOOD  PRESSURE 90 tablet 1  . rosuvastatin (CRESTOR) 5 MG tablet Take 1 tablet (5 mg total) by mouth every evening. For cholesterol. 90 tablet 3   No current facility-administered medications for this visit.    No Known Allergies  Family History  Problem Relation Age of Onset  . Breast cancer Neg Hx     Social History   Socioeconomic History  . Marital status: Married    Spouse name: Not on file  . Number of children: Not on file  . Years of education: Not on file  . Highest education level: Not on file  Occupational History  . Not on file  Tobacco Use  . Smoking status: Never Smoker  . Smokeless tobacco: Never Used  Vaping Use  . Vaping Use: Never used  Substance and Sexual Activity  . Alcohol use: No  . Drug use: No  . Sexual activity: Not on file  Other Topics Concern  . Not on file  Social History Narrative   Married.   Moved from [place]. Originally from [place].   Retired.      Social Determinants of Health   Financial Resource Strain:   . Difficulty of Paying Living Expenses: Not on file  Food Insecurity:   . Worried About Sales executive in the Last Year: Not on file  . Ran Out of Food in the Last Year: Not on file  Transportation Needs:   . Lack of Transportation (Medical): Not on file  . Lack of Transportation (Non-Medical): Not on file  Physical Activity:   . Days of Exercise per Week: Not on file  . Minutes of Exercise per Session: Not on file  Stress:   . Feeling of Stress : Not on file  Social Connections:   . Frequency of Communication with Friends and Family: Not on file  . Frequency of Social Gatherings with Friends and Family: Not on file  . Attends Religious Services: Not on file  . Active Member of Clubs or Organizations: Not on file  . Attends Archivist Meetings: Not on file  . Marital Status: Not on file  Intimate Partner Violence:   . Fear of Current or Ex-Partner: Not on file  . Emotionally Abused: Not on file  . Physically Abused: Not on file  . Sexually Abused: Not on file    Hospitiliaztions: None  Health Maintenance:    Flu: Declines   Tetanus: Completed in 2014  Pneumovax: Completed in 2014  Prevnar: Completed in 2016  Zostavax: Completed in 2017, declines Shingrix  Bone Density: Completed in  2020  Colonoscopy: Completed Cologuard in 2021  Eye Doctor: Completed in 2021  Dental Exam: Completes semi-annually   Mammogram: Completed in 2021  Hep C Screening: Negative    Providers: Alma Friendly, PCP; optometrist and dentist   BP Readings from Last 3 Encounters:  07/31/20 (!) 142/80  07/28/19 126/84  06/30/18 124/84      I have personally reviewed and have noted: 1. The patient's medical and social history 2. Their use of alcohol, tobacco or illicit drugs 3. Their current medications and supplements 4. The patient's functional ability including ADL's, fall risks, home safety risks and  hearing or visual impairment. 5. Diet and physical activities 6. Evidence for depression or mood disorder  Subjective:   Review of Systems:   Constitutional: Denies fever,  malaise, fatigue, headache or abrupt weight changes.  HEENT: Denies eye pain, eye redness, ear pain, ringing in the ears, wax buildup, runny nose, nasal congestion, bloody nose, or sore throat. Respiratory: Denies difficulty breathing, shortness of breath, cough or sputum production.   Cardiovascular: Denies chest pain, chest tightness, palpitations or swelling in the hands or feet.  Gastrointestinal: Denies abdominal pain, bloating, constipation, diarrhea or blood in the stool.  GU: Denies urgency, frequency, pain with urination, burning sensation, blood in urine, odor or discharge. Musculoskeletal: Denies decrease in range of motion, difficulty with gait, muscle pain or joint pain and swelling.  Skin: Denies redness, rashes, lesions or ulcercations.  Neurological: Denies dizziness, difficulty with memory, difficulty with speech or problems with balance and coordination.  Psychiatric: Denies concerns for anxiety or depression.   No other specific complaints in a complete review of systems (except as listed in HPI above).  Objective:  PE:   BP (!) 142/80 (BP Location: Right Arm, Patient Position: Sitting, Cuff Size: Normal)   Pulse 71   Temp (!) 96.9 F (36.1 C) (Temporal)   Ht 5' 4.5" (1.638 m)   Wt 169 lb 8 oz (76.9 kg)   SpO2 97%   BMI 28.65 kg/m  Wt Readings from Last 3 Encounters:  07/31/20 169 lb 8 oz (76.9 kg)  07/28/19 160 lb (72.6 kg)  06/30/18 172 lb 8 oz (78.2 kg)    General: Appears their stated age, well developed, well nourished in NAD. Skin: Warm, dry and intact. No rashes, lesions or ulcerations noted. HEENT: Head: normal shape and size; Eyes: sclera white, no icterus, conjunctiva pink, PERRLA and EOMs intact; Ears: Tm's gray and intact, normal light reflex; Nose: mucosa pink and moist, septum midline; Throat/Mouth: Teeth present, mucosa pink and moist, no exudate, lesions or ulcerations noted.  Neck: Normal range of motion. Neck supple, trachea midline. No massses,  lumps or thyromegaly present.  Cardiovascular: Normal rate and rhythm. S1,S2 noted.  No murmur, rubs or gallops noted. No JVD or BLE edema. No carotid bruits noted. Pulmonary/Chest: Normal effort and positive vesicular breath sounds. No respiratory distress. No wheezes, rales or ronchi noted.  Abdomen: Soft and nontender. Normal bowel sounds, no bruits noted. No distention or masses noted. Liver, spleen and kidneys non palpable. Musculoskeletal: Normal range of motion. No signs of joint swelling. No difficulty with gait.  Neurological: Alert and oriented. Cranial nerves II-XII intact. Coordination normal. +DTRs bilaterally. Psychiatric: Mood and affect normal. Behavior is normal. Judgment and thought content normal.    BMET    Component Value Date/Time   NA 141 07/28/2020 0750   K 3.7 07/28/2020 0750   CL 104 07/28/2020 0750   CO2 32 07/28/2020 0750  GLUCOSE 129 (H) 07/28/2020 0750   BUN 12 07/28/2020 0750   CREATININE 0.73 07/28/2020 0750   CALCIUM 8.6 07/28/2020 0750   GFRNONAA >60 07/21/2017 1425   GFRAA >60 07/21/2017 1425    Lipid Panel     Component Value Date/Time   CHOL 136 07/28/2020 0750   TRIG 80.0 07/28/2020 0750   HDL 61.80 07/28/2020 0750   CHOLHDL 2 07/28/2020 0750   VLDL 16.0 07/28/2020 0750   LDLCALC 59 07/28/2020 0750    CBC    Component Value Date/Time   WBC 10.7 (H) 07/22/2019 0748   RBC 4.42 07/22/2019 0748   HGB 14.0 07/22/2019 0748   HCT 40.9 07/22/2019 0748   PLT 288.0 07/22/2019 0748   MCV 92.7 07/22/2019 0748   MCH 31.7 07/21/2017 1425   MCHC 34.2 07/22/2019 0748   RDW 12.7 07/22/2019 0748    Hgb A1C Lab Results  Component Value Date   HGBA1C 6.5 07/28/2020      Assessment and Plan:   Medicare Annual Wellness Visit:  Diet: She endorses a poor diet recently, is eating more candy. Physical activity: Walking some Depression/mood screen: Negative Hearing: Intact to whispered voice Visual acuity: Grossly normal, performs annual  eye exam  ADLs: Capable Fall risk: None Home safety: Good Cognitive evaluation: Intact to orientation, naming, recall and repetition EOL planning: Adv directives in process.   Preventative Medicine:  Declines influenza and Shingrix vaccinations today. Mammogram and bone density scan UTD. Colon cancer screening UTD, due in 2024. Strongly advised she work on diet and exercise given new diabetes diagnosis. She will complete advanced directives. All recommendations provided at end of visit today.  Exam today unremarkable. Labs reviewed.  Next appointment: 3 months

## 2020-07-31 NOTE — Assessment & Plan Note (Signed)
Borderline in the office today, normal reading a few days ago documented in [place]. Continue irbesartan-HCTZ 150-12.5 mg daily.  CMP reviewed.

## 2020-07-31 NOTE — Assessment & Plan Note (Signed)
Following with ophthalmology. 

## 2020-07-31 NOTE — Assessment & Plan Note (Signed)
New diagnosis with A1C of 6.5 on recent labs.  Discussed this diagnosis.  Discussed necessary dietary changes and to increase exercise.  Discussed options for treatment, she declines mediation treatment and will work on diet and exercise. This is reasonable given her age.  Follow up in 3 months for diabetes check.

## 2020-07-31 NOTE — Patient Instructions (Signed)
Start exercising. You should be getting 150 minutes of moderate intensity exercise weekly.  It is important that you improve your diet. Please limit carbohydrates in the form of white bread, rice, pasta, sweets, fast food, fried food, sugary drinks, etc. Increase your consumption of fresh fruits and vegetables, whole grains, lean protein.  Ensure you are consuming 64 ounces of water daily.  Please schedule a follow up appointment in 3 months for diabetes check.  It was a pleasure to see you today!   Type 2 Diabetes Mellitus, Diagnosis, Adult Type 2 diabetes (type 2 diabetes mellitus) is a long-term (chronic) disease. It may be caused by one or both of these problems:  Your pancreas does not make enough of a hormone called insulin.  Your body does not react in a normal way to insulin that it makes. Insulin lets sugars (glucose) go into cells in your body. This gives you energy. If you have type 2 diabetes, sugars cannot get into cells. This causes high blood sugar (hyperglycemia). Your doctor will set treatment goals for you. Generally, you should have these blood sugar levels:  Before meals (preprandial): 80-130 mg/dL (4.4-7.2 mmol/L).  After meals (postprandial): below 180 mg/dL (10 mmol/L).  A1c (hemoglobin A1c) level: less than 7%. Follow these instructions at home: Questions to ask your doctor  You may want to ask these questions: ? Do I need to meet with a diabetes educator? ? Where can I find a support group for people with diabetes? ? What equipment will I need to care for myself at home? ? What diabetes medicines do I need? When should I take them? ? How often do I need to check my blood sugar? ? What number can I call if I have questions? ? When is my next doctor's visit? General instructions  Take over-the-counter and prescription medicines only as told by your doctor.  Keep all follow-up visits as told by your doctor. This is important. Contact a doctor if:  Your  blood sugar is at or above 240 mg/dL (13.3 mmol/L) for 2 days in a row.  You have been sick for 2 days or more, and you are not getting better.  You have had a fever for 2 days or more, and you are not getting better.  You have any of these problems for more than 6 hours: ? You cannot eat or drink. ? You feel sick to your stomach (nauseous). ? You throw up (vomit). ? You have watery poop (diarrhea). Get help right away if:  Your blood sugar is lower than 54 mg/dL (3 mmol/L).  You get confused.  You have trouble: ? Thinking clearly. ? Breathing.  You have moderate or large ketone levels in your pee (urine). Summary  Type 2 diabetes is a long-term (chronic) disease. Your pancreas may not make enough of a hormone called insulin, or your body may not react normally to insulin that it makes.  Take over-the-counter and prescription medicines only as told by your doctor.  Keep all follow-up visits as told by your doctor. This is important. This information is not intended to replace advice given to you by your health care provider. Make sure you discuss any questions you have with your health care provider. Document Revised: 11/21/2017 Document Reviewed: 10/27/2015 Elsevier Patient Education  2020 [employer].

## 2020-08-30 ENCOUNTER — Ambulatory Visit: Payer: Medicare Other

## 2020-09-08 ENCOUNTER — Other Ambulatory Visit: Payer: Self-pay | Admitting: Primary Care

## 2020-09-08 DIAGNOSIS — I10 Essential (primary) hypertension: Secondary | ICD-10-CM

## 2020-09-27 ENCOUNTER — Other Ambulatory Visit: Payer: Self-pay | Admitting: Primary Care

## 2020-09-27 DIAGNOSIS — E785 Hyperlipidemia, unspecified: Secondary | ICD-10-CM

## 2020-10-31 ENCOUNTER — Ambulatory Visit (INDEPENDENT_AMBULATORY_CARE_PROVIDER_SITE_OTHER): Payer: Medicare Other | Admitting: Primary Care

## 2020-10-31 ENCOUNTER — Encounter: Payer: Self-pay | Admitting: Primary Care

## 2020-10-31 ENCOUNTER — Other Ambulatory Visit: Payer: Self-pay

## 2020-10-31 VITALS — BP 126/78 | HR 78 | Temp 96.2°F | Ht 64.5 in | Wt 171.0 lb

## 2020-10-31 DIAGNOSIS — E119 Type 2 diabetes mellitus without complications: Secondary | ICD-10-CM

## 2020-10-31 LAB — POCT GLYCOSYLATED HEMOGLOBIN (HGB A1C): Hemoglobin A1C: 6.3 % — AB (ref 4.0–5.6)

## 2020-10-31 NOTE — Assessment & Plan Note (Addendum)
A1C Improved to 6.3, commended her on this! Encouraged her to get back on track with her diet, increase walking.  No medications needed. Foot exam today. Managed on ARB and satin.  Repeat A1C in 6 months.

## 2020-10-31 NOTE — Patient Instructions (Addendum)
It is important that you improve your diet. Please limit carbohydrates in the form of white bread, rice, pasta, sweets, fast food, fried food, sugary drinks, etc. Increase your consumption of fresh fruits and vegetables, whole grains, lean protein.  Ensure you are consuming 64 ounces of water daily.  Start exercising. You should be getting 150 minutes of exercise weekly.  Please schedule a follow up appointment in 6 months.   It was a pleasure to see you today!   Diabetes Mellitus and Nutrition, Adult When you have diabetes, or diabetes mellitus, it is very important to have healthy eating habits because your blood sugar (glucose) levels are greatly affected by what you eat and drink. Eating healthy foods in the right amounts, at about the same times every day, can help you:  Control your blood glucose.  Lower your risk of heart disease.  Improve your blood pressure.  Reach or maintain a healthy weight. What can affect my meal plan? Every person with diabetes is different, and each person has different needs for a meal plan. Your health care provider may recommend that you work with a dietitian to make a meal plan that is best for you. Your meal plan may vary depending on factors such as:  The calories you need.  The medicines you take.  Your weight.  Your blood glucose, blood pressure, and cholesterol levels.  Your activity level.  Other health conditions you have, such as heart or kidney disease. How do carbohydrates affect me? Carbohydrates, also called carbs, affect your blood glucose level more than any other type of food. Eating carbs naturally raises the amount of glucose in your blood. Carb counting is a method for keeping track of how many carbs you eat. Counting carbs is important to keep your blood glucose at a healthy level, especially if you use insulin or take certain oral diabetes medicines. It is important to know how many carbs you can safely have in each meal.  This is different for every person. Your dietitian can help you calculate how many carbs you should have at each meal and for each snack. How does alcohol affect me? Alcohol can cause a sudden decrease in blood glucose (hypoglycemia), especially if you use insulin or take certain oral diabetes medicines. Hypoglycemia can be a life-threatening condition. Symptoms of hypoglycemia, such as sleepiness, dizziness, and confusion, are similar to symptoms of having too much alcohol.  Do not drink alcohol if: ? Your health care provider tells you not to drink. ? You are pregnant, may be pregnant, or are planning to become pregnant.  If you drink alcohol: ? Do not drink on an empty stomach. ? Limit how much you use to:  0-1 drink a day for women.  0-2 drinks a day for men. ? Be aware of how much alcohol is in your drink. In the [place] one drink equals one 12 oz bottle of beer (355 mL), one 5 oz glass of wine (148 mL), or one 1 oz glass of hard liquor (44 mL). ? Keep yourself hydrated with water, diet soda, or unsweetened iced tea.  Keep in mind that regular soda, juice, and other mixers may contain a lot of sugar and must be counted as carbs. What are tips for following this plan? Reading food labels  Start by checking the serving size on the "Nutrition Facts" label of packaged foods and drinks. The amount of calories, carbs, fats, and other nutrients listed on the label is based on one serving of  the item. Many items contain more than one serving per package.  Check the total grams (g) of carbs in one serving. You can calculate the number of servings of carbs in one serving by dividing the total carbs by 15. For example, if a food has 30 g of total carbs per serving, it would be equal to 2 servings of carbs.  Check the number of grams (g) of saturated fats and trans fats in one serving. Choose foods that have a low amount or none of these fats.  Check the number of milligrams (mg) of salt (sodium)  in one serving. Most people should limit total sodium intake to less than 2,300 mg per day.  Always check the nutrition information of foods labeled as "low-fat" or "nonfat." These foods may be higher in added sugar or refined carbs and should be avoided.  Talk to your dietitian to identify your daily goals for nutrients listed on the label. Shopping  Avoid buying canned, pre-made, or processed foods. These foods tend to be high in fat, sodium, and added sugar.  Shop around the outside edge of the grocery store. This is where you will most often find fresh fruits and vegetables, bulk grains, fresh meats, and fresh dairy. Cooking  Use low-heat cooking methods, such as baking, instead of high-heat cooking methods like deep frying.  Cook using healthy oils, such as olive, canola, or sunflower oil.  Avoid cooking with butter, cream, or high-fat meats. Meal planning  Eat meals and snacks regularly, preferably at the same times every day. Avoid going long periods of time without eating.  Eat foods that are high in fiber, such as fresh fruits, vegetables, beans, and whole grains. Talk with your dietitian about how many servings of carbs you can eat at each meal.  Eat 4-6 oz (112-168 g) of lean protein each day, such as lean meat, chicken, fish, eggs, or tofu. One ounce (oz) of lean protein is equal to: ? 1 oz (28 g) of meat, chicken, or fish. ? 1 egg. ?  cup (62 g) of tofu.  Eat some foods each day that contain healthy fats, such as avocado, nuts, seeds, and fish.   What foods should I eat? Fruits Berries. Apples. Oranges. Peaches. Apricots. Plums. Grapes. Mango. Papaya. Pomegranate. Kiwi. Cherries. Vegetables Lettuce. Spinach. Leafy greens, including kale, chard, collard greens, and mustard greens. Beets. Cauliflower. Cabbage. Broccoli. Carrots. Green beans. Tomatoes. Peppers. Onions. Cucumbers. Brussels sprouts. Grains Whole grains, such as whole-wheat or whole-grain bread, crackers,  tortillas, cereal, and pasta. Unsweetened oatmeal. Quinoa. Brown or wild rice. Meats and other proteins Seafood. Poultry without skin. Lean cuts of poultry and beef. Tofu. Nuts. Seeds. Dairy Low-fat or fat-free dairy products such as milk, yogurt, and cheese. The items listed above may not be a complete list of foods and beverages you can eat. Contact a dietitian for more information. What foods should I avoid? Fruits Fruits canned with syrup. Vegetables Canned vegetables. Frozen vegetables with butter or cream sauce. Grains Refined white flour and flour products such as bread, pasta, snack foods, and cereals. Avoid all processed foods. Meats and other proteins Fatty cuts of meat. Poultry with skin. Breaded or fried meats. Processed meat. Avoid saturated fats. Dairy Full-fat yogurt, cheese, or milk. Beverages Sweetened drinks, such as soda or iced tea. The items listed above may not be a complete list of foods and beverages you should avoid. Contact a dietitian for more information. Questions to ask a health care provider  Do I  need to meet with a diabetes educator?  Do I need to meet with a dietitian?  What number can I call if I have questions?  When are the best times to check my blood glucose? Where to find more information:  American Diabetes Association: diabetes.org  Academy of Nutrition and Dietetics: www.eatright.[employer] of Diabetes and Digestive and Kidney Diseases: CarFlippers.tn  Association of Diabetes Care and Education Specialists: www.diabeteseducator.org Summary  It is important to have healthy eating habits because your blood sugar (glucose) levels are greatly affected by what you eat and drink.  A healthy meal plan will help you control your blood glucose and maintain a healthy lifestyle.  Your health care provider may recommend that you work with a dietitian to make a meal plan that is best for you.  Keep in mind that carbohydrates  (carbs) and alcohol have immediate effects on your blood glucose levels. It is important to count carbs and to use alcohol carefully. This information is not intended to replace advice given to you by your health care provider. Make sure you discuss any questions you have with your health care provider. Document Revised: 08/31/2019 Document Reviewed: 08/31/2019 Elsevier Patient Education  2021 [employer].

## 2020-10-31 NOTE — Progress Notes (Signed)
Subjective:    Patient ID: Kim Holloway, female    DOB: 09/10/1946, 75 y.o.   MRN: [id number]  HPI  This visit occurred during the SARS-CoV-2 public health emergency.  Safety protocols were in place, including screening questions prior to the visit, additional usage of staff PPE, and extensive cleaning of exam room while observing appropriate contact time as indicated for disinfecting solutions.   [name] is a 75 year old female with a history of type 2 diabetes, hypertension who presents today for follow up of diabetes.  Current medications include: None.  She is checking her blood glucose 0 times daily.   She had been doing well with her diet until Christmas, is working on her diet now. She is walking daily, has not been walking as she was doing due to the colder weather.   She has a strong family history of diabetes in her mother and three other siblings.   Last A1C: 6.5 in October 2021, 6.3 today Last Eye Exam: Due Last Foot Exam: Due Pneumonia Vaccination: Completed in 2016 ACE/ARB: Irbesartan Statin: Crestor   BP Readings from Last 3 Encounters:  10/31/20 126/78  07/31/20 (!) 142/80  07/28/19 126/84     Review of Systems  Respiratory: Negative for shortness of breath.   Cardiovascular: Negative for chest pain.  Neurological: Negative for dizziness and numbness.       Past Medical History:  Diagnosis Date  . Abnormal breast biopsy 10/04/2013   right breast  . Family history of adverse reaction to anesthesia 2016   brother had difficulty waking up after surgery  . History of colonoscopy 2008  . Hypertension   . Mammographic calcification 06/2017     Social History   Socioeconomic History  . Marital status: Married    Spouse name: Not on file  . Number of children: Not on file  . Years of education: Not on file  . Highest education level: Not on file  Occupational History  . Not on file  Tobacco Use  . Smoking status: Never Smoker  . Smokeless  tobacco: Never Used  Vaping Use  . Vaping Use: Never used  Substance and Sexual Activity  . Alcohol use: No  . Drug use: No  . Sexual activity: Not on file  Other Topics Concern  . Not on file  Social History Narrative   Married.   Moved from [place]. Originally from [place].   Retired.      Social Determinants of Health   Financial Resource Strain: Not on file  Food Insecurity: Not on file  Transportation Needs: Not on file  Physical Activity: Not on file  Stress: Not on file  Social Connections: Not on file  Intimate Partner Violence: Not on file    Past Surgical History:  Procedure Laterality Date  . BREAST BIOPSY Right 2014   stereo bx benign calcs  . BREAST BIOPSY Left ?   cyst  . CATARACT EXTRACTION  01/19/2016, 02/19/2016  . COLONOSCOPY  2008  . CYST EXCISION Right 07/31/2017   Procedure: CYST REMOVAL RIGHT INDEX FINGER;  Surgeon: Hessie Knows, MD;  Location: [facility] ORS;  Service: Orthopedics;  Laterality: Right;  . EYE SURGERY Left 12/2016   laser surgery    Family History  Problem Relation Age of Onset  . Breast cancer Neg Hx     No Known Allergies  Current Outpatient Medications on File Prior to Visit  Medication Sig Dispense Refill  . aspirin EC 81 MG tablet Take 81  mg by mouth daily.    . Calcium Carb-Cholecalciferol (CALCIUM 600 + D PO) Take 2 tablets by mouth daily.    . cholecalciferol (VITAMIN D) 1000 units tablet Take 1,000 Units by mouth daily.    . irbesartan-hydrochlorothiazide (AVALIDE) 150-12.5 MG tablet TAKE 1 TABLET BY MOUTH ONCE DAILY FOR HIGH BLOOD  PRESSURE 90 tablet 3  . rosuvastatin (CRESTOR) 5 MG tablet TAKE 1 TABLET BY MOUTH IN  THE EVENING FOR CHOLESTEROL 90 tablet 0   No current facility-administered medications on file prior to visit.    BP 126/78   Pulse 78   Temp (!) 96.2 F (35.7 C) (Temporal)   Ht 5' 4.5" (1.638 m)   Wt 171 lb (77.6 kg)   SpO2 97%   BMI 28.90 kg/m    Objective:   Physical Exam Constitutional:       Appearance: She is well-nourished.  Cardiovascular:     Rate and Rhythm: Normal rate and regular rhythm.  Pulmonary:     Effort: Pulmonary effort is normal.     Breath sounds: Normal breath sounds.  Musculoskeletal:     Cervical back: Neck supple.  Skin:    General: Skin is warm and dry.  Psychiatric:        Mood and Affect: Mood and affect and mood normal.            Assessment & Plan:

## 2020-12-19 ENCOUNTER — Other Ambulatory Visit: Payer: Self-pay | Admitting: Primary Care

## 2020-12-19 DIAGNOSIS — E785 Hyperlipidemia, unspecified: Secondary | ICD-10-CM

## 2021-03-06 DIAGNOSIS — H40003 Preglaucoma, unspecified, bilateral: Secondary | ICD-10-CM | POA: Diagnosis not present

## 2021-03-13 DIAGNOSIS — H35371 Puckering of macula, right eye: Secondary | ICD-10-CM | POA: Diagnosis not present

## 2021-03-13 DIAGNOSIS — H40003 Preglaucoma, unspecified, bilateral: Secondary | ICD-10-CM | POA: Diagnosis not present

## 2021-03-13 LAB — HM DIABETES EYE EXAM

## 2021-03-15 ENCOUNTER — Other Ambulatory Visit: Payer: Self-pay

## 2021-03-15 ENCOUNTER — Emergency Department
Admission: EM | Admit: 2021-03-15 | Discharge: 2021-03-15 | Disposition: A | Discharge status: Home / Self Care | Payer: Medicare Other | Attending: Emergency Medicine | Admitting: Emergency Medicine

## 2021-03-15 ENCOUNTER — Emergency Department: Payer: Medicare Other

## 2021-03-15 DIAGNOSIS — E119 Type 2 diabetes mellitus without complications: Secondary | ICD-10-CM | POA: Insufficient documentation

## 2021-03-15 DIAGNOSIS — Y92002 Bathroom of unspecified non-institutional (private) residence single-family (private) house as the place of occurrence of the external cause: Secondary | ICD-10-CM | POA: Diagnosis not present

## 2021-03-15 DIAGNOSIS — W010XXA Fall on same level from slipping, tripping and stumbling without subsequent striking against object, initial encounter: Secondary | ICD-10-CM | POA: Insufficient documentation

## 2021-03-15 DIAGNOSIS — I10 Essential (primary) hypertension: Secondary | ICD-10-CM | POA: Insufficient documentation

## 2021-03-15 DIAGNOSIS — Z7982 Long term (current) use of aspirin: Secondary | ICD-10-CM | POA: Insufficient documentation

## 2021-03-15 DIAGNOSIS — Z79899 Other long term (current) drug therapy: Secondary | ICD-10-CM | POA: Insufficient documentation

## 2021-03-15 DIAGNOSIS — W19XXXA Unspecified fall, initial encounter: Secondary | ICD-10-CM

## 2021-03-15 DIAGNOSIS — S42292A Other displaced fracture of upper end of left humerus, initial encounter for closed fracture: Secondary | ICD-10-CM | POA: Insufficient documentation

## 2021-03-15 DIAGNOSIS — M19012 Primary osteoarthritis, left shoulder: Secondary | ICD-10-CM | POA: Diagnosis not present

## 2021-03-15 DIAGNOSIS — S42209A Unspecified fracture of upper end of unspecified humerus, initial encounter for closed fracture: Secondary | ICD-10-CM

## 2021-03-15 DIAGNOSIS — S42202A Unspecified fracture of upper end of left humerus, initial encounter for closed fracture: Secondary | ICD-10-CM | POA: Diagnosis not present

## 2021-03-15 DIAGNOSIS — S4992XA Unspecified injury of left shoulder and upper arm, initial encounter: Secondary | ICD-10-CM | POA: Diagnosis present

## 2021-03-15 HISTORY — DX: Unspecified fracture of upper end of unspecified humerus, initial encounter for closed fracture: S42.209A

## 2021-03-15 MED ORDER — OXYCODONE-ACETAMINOPHEN 5-325 MG PO TABS: 1.0000 | ORAL_TABLET | Freq: Four times a day (QID) | ORAL | 0 refills | Status: DC | PRN

## 2021-03-15 MED ORDER — OXYCODONE-ACETAMINOPHEN 5-325 MG PO TABS
1.0000 | ORAL_TABLET | Freq: Once | ORAL | Status: AC
  Administered 2021-03-15: 1 via ORAL
  Filled 2021-03-15: qty 1

## 2021-03-15 NOTE — ED Provider Notes (Signed)
[facility] Emergency Department Provider Note  ____________________________________________  Time seen: Approximately 11:22 PM  I have reviewed the triage vital signs and the nursing notes.   HISTORY  Chief Complaint Fall    HPI Kim Holloway is a 75 y.o. female who presents the emergency department complaining of left shoulder injury.  Patient states that she was in her bathroom tonight, slipped on some water that was on the floor falling and landing on her shoulder.  She did not hit her head or lose consciousness.  She denies any headache, neck pain or other musculoskeletal pain other than the left shoulder.  Reduced range of motion at this time.  No numbness or tingling to the left upper extremity.  No medications prior to arrival.  No history of previous injuries to this joint.       Past Medical History:  Diagnosis Date   Abnormal breast biopsy 10/04/2013   right breast   Family history of adverse reaction to anesthesia 2016   brother had difficulty waking up after surgery   History of colonoscopy 2008   Hypertension    Mammographic calcification 06/2017    Patient Active Problem List   Diagnosis Date Noted   Medicare annual wellness visit, subsequent 07/31/2020   Type 2 diabetes mellitus ([place]) 06/30/2018   Glaucoma of left eye 05/27/2017   Finger mass, right 05/27/2017   Essential hypertension 05/27/2017    Past Surgical History:  Procedure Laterality Date   BREAST BIOPSY Right 2014   stereo bx benign calcs   BREAST BIOPSY Left ?   cyst   CATARACT EXTRACTION  01/19/2016, 02/19/2016   COLONOSCOPY  2008   CYST EXCISION Right 07/31/2017   Procedure: CYST REMOVAL RIGHT INDEX FINGER;  Surgeon: Hessie Knows, MD;  Location: [facility] ORS;  Service: Orthopedics;  Laterality: Right;   EYE SURGERY Left 12/2016   laser surgery    Prior to Admission medications   Medication Sig Start Date End Date Taking? Authorizing Provider   oxyCODONE-acetaminophen (PERCOCET/ROXICET) 5-325 MG tablet Take 1 tablet by mouth every 6 (six) hours as needed for severe pain. 03/15/21  Yes Inas [address]a, Charline Bills, PA-C  aspirin EC 81 MG tablet Take 81 mg by mouth daily.    [provider]  Calcium Carb-Cholecalciferol (CALCIUM 600 + D PO) Take 2 tablets by mouth daily.    [provider]  cholecalciferol (VITAMIN D) 1000 units tablet Take 1,000 Units by mouth daily.    [provider]  irbesartan-hydrochlorothiazide (AVALIDE) 150-12.5 MG tablet TAKE 1 TABLET BY MOUTH ONCE DAILY FOR HIGH BLOOD  PRESSURE 09/11/20   Pleas Koch, NP  rosuvastatin (CRESTOR) 5 MG tablet TAKE 1 TABLET BY MOUTH IN  THE EVENING FOR CHOLESTEROL 12/20/20   Pleas Koch, NP    Allergies Patient has no known allergies.  Family History  Problem Relation Age of Onset   Breast cancer Neg Hx     Social History Social History   Tobacco Use   Smoking status: Never   Smokeless tobacco: Never  Vaping Use   Vaping Use: Never used  Substance Use Topics   Alcohol use: No   Drug use: No     Review of Systems  Constitutional: No fever/chills Eyes: No visual changes. No discharge ENT: No upper respiratory complaints. Cardiovascular: no chest pain. Respiratory: no cough. No SOB. Gastrointestinal: No abdominal pain.  No nausea, no vomiting.  No diarrhea.  No constipation. Musculoskeletal: Positive for left shoulder injury/pain Skin: Negative for  rash, abrasions, lacerations, ecchymosis. Neurological: Negative for headaches, focal weakness or numbness.  10 System ROS otherwise negative.  ____________________________________________   PHYSICAL EXAM:  VITAL SIGNS: ED Triage Vitals  Enc Vitals Group     BP 03/15/21 2151 (!) 170/87     Pulse Rate 03/15/21 2151 95     Resp 03/15/21 2151 20     Temp 03/15/21 2151 97.9 F (36.6 C)     Temp Source 03/15/21 2151 Oral     SpO2 03/15/21 2151 98 %     Weight 03/15/21 2148 170  lb (77.1 kg)     Height 03/15/21 2148 5\' 5"  (1.651 m)     Head Circumference --      Peak Flow --      Pain Score 03/15/21 2147 7     Pain Loc --      Pain Edu? --      Excl. in [place]? --      Constitutional: Alert and oriented. Well appearing and in no acute distress. Eyes: Conjunctivae are normal. PERRL. EOMI. Head: Atraumatic. ENT:      Ears:       Nose: No congestion/rhinnorhea.      Mouth/Throat: Mucous membranes are moist.  Neck: No stridor.  No cervical spine tenderness to palpation.  Cardiovascular: Normal rate, regular rhythm. Normal S1 and S2.  Good peripheral circulation. Respiratory: Normal respiratory effort without tachypnea or retractions. Lungs CTAB. Good air entry to the bases with no decreased or absent breath sounds. Musculoskeletal: Reduced range of motion to the left upper extremity otherwise full range of motion.  No gross deformities appreciated.  Visualization of left shoulder reveals no obvious deformity.  Reduced range of motion.  Tenderness throughout the shoulder with focus along the anterior and lateral aspect of the shoulder itself.  No active or passive range of motion is performed at this time.  Examination of the mid and distal humerus reveals no tenderness.  Examination of the elbow and wrist is unremarkable.  Radial pulses sensation intact distally. Neurologic:  Normal speech and language. No gross focal neurologic deficits are appreciated.  Skin:  Skin is warm, dry and intact. No rash noted. Psychiatric: Mood and affect are normal. Speech and behavior are normal. Patient exhibits appropriate insight and judgement.   ____________________________________________   LABS (all labs ordered are listed, but only abnormal results are displayed)  Labs Reviewed - No data to display ____________________________________________  EKG   ____________________________________________  RADIOLOGY I personally viewed and evaluated these images as part of my  medical decision making, as well as reviewing the written report by the radiologist.  ED Provider Interpretation: Acute fracture of the proximal left humerus with apparent separation of the [facility] joint  DG Shoulder Left  Result Date: 03/15/2021 CLINICAL DATA:  Status post trauma. EXAM: LEFT SHOULDER - 2+ VIEW COMPARISON:  None. FINDINGS: An acute, comminuted fracture deformity is seen involving the head and surgical neck of the proximal left humerus. Approximately 9 mm superior displacement of the distal left clavicle is seen with respect to the left acromion. Degenerative changes are also seen involving the left acromioclavicular joint and left glenohumeral articulation. Soft tissues are unremarkable. IMPRESSION: Acute fracture of the proximal left humerus. Electronically Signed   By: Virgina Norfolk M.D.   On: 03/15/2021 22:14    ____________________________________________    PROCEDURES  Procedure(s) performed:    Procedures    Medications  oxyCODONE-acetaminophen (PERCOCET/ROXICET) 5-325 MG per tablet 1 tablet (has no administration in time  range)     ____________________________________________   INITIAL IMPRESSION / ASSESSMENT AND PLAN / ED COURSE  Pertinent labs & imaging results that were available during my care of the patient were reviewed by me and considered in my medical decision making (see chart for details).  Review of the [place] CSRS was performed in accordance of the [place] prior to dispensing any controlled drugs.           Patient's diagnosis is consistent with proximal humerus fracture.  Patient presents emergency department after mechanical fall landing on her shoulder tonight.  She denies any other injury or complaint and remainder of exam is reassuring.  No active or passive range of motion is undertaken at this time and findings on imaging was consistent with proximal humerus fracture with AC separation.  Patient will be placed in sling/shoulder immobilizer.   Pain medication for symptom relief.  Follow-up with orthopedics for further management.  Return precautions discussed with the patient. Patient is given ED precautions to return to the ED for any worsening or new symptoms.     ____________________________________________  FINAL CLINICAL IMPRESSION(S) / ED DIAGNOSES  Final diagnoses:  Fall, initial encounter  Other closed displaced fracture of proximal end of left humerus, initial encounter      NEW MEDICATIONS STARTED DURING THIS VISIT:  ED Discharge Orders          Ordered    oxyCODONE-acetaminophen (PERCOCET/ROXICET) 5-325 MG tablet  Every 6 hours PRN        03/15/21 2329                This chart was dictated using voice recognition software/Dragon. Despite best efforts to proofread, errors can occur which can change the meaning. Any change was purely unintentional.    Darletta Moll, PA-C 03/15/21 2330    Nance Pear, MD 03/15/21 2330

## 2021-03-15 NOTE — ED Triage Notes (Signed)
Pt states she slipped coming out of the bathtub and is now having left shoulder pain. Denies any head or neck injury.

## 2021-03-21 DIAGNOSIS — S42255A Nondisplaced fracture of greater tuberosity of left humerus, initial encounter for closed fracture: Secondary | ICD-10-CM | POA: Diagnosis not present

## 2021-03-28 ENCOUNTER — Encounter: Payer: Self-pay | Admitting: Primary Care

## 2021-03-28 DIAGNOSIS — E785 Hyperlipidemia, unspecified: Secondary | ICD-10-CM | POA: Diagnosis not present

## 2021-03-28 DIAGNOSIS — I10 Essential (primary) hypertension: Secondary | ICD-10-CM | POA: Diagnosis not present

## 2021-03-28 DIAGNOSIS — Z9181 History of falling: Secondary | ICD-10-CM | POA: Diagnosis not present

## 2021-03-28 DIAGNOSIS — Z7982 Long term (current) use of aspirin: Secondary | ICD-10-CM | POA: Diagnosis not present

## 2021-03-28 DIAGNOSIS — S42252D Displaced fracture of greater tuberosity of left humerus, subsequent encounter for fracture with routine healing: Secondary | ICD-10-CM | POA: Diagnosis not present

## 2021-03-28 DIAGNOSIS — E119 Type 2 diabetes mellitus without complications: Secondary | ICD-10-CM | POA: Diagnosis not present

## 2021-04-02 DIAGNOSIS — E119 Type 2 diabetes mellitus without complications: Secondary | ICD-10-CM | POA: Diagnosis not present

## 2021-04-02 DIAGNOSIS — Z7982 Long term (current) use of aspirin: Secondary | ICD-10-CM | POA: Diagnosis not present

## 2021-04-02 DIAGNOSIS — I10 Essential (primary) hypertension: Secondary | ICD-10-CM | POA: Diagnosis not present

## 2021-04-02 DIAGNOSIS — E785 Hyperlipidemia, unspecified: Secondary | ICD-10-CM | POA: Diagnosis not present

## 2021-04-02 DIAGNOSIS — Z9181 History of falling: Secondary | ICD-10-CM | POA: Diagnosis not present

## 2021-04-02 DIAGNOSIS — S42252D Displaced fracture of greater tuberosity of left humerus, subsequent encounter for fracture with routine healing: Secondary | ICD-10-CM | POA: Diagnosis not present

## 2021-04-03 ENCOUNTER — Telehealth: Payer: Self-pay | Admitting: *Deleted

## 2021-04-03 DIAGNOSIS — E1169 Type 2 diabetes mellitus with other specified complication: Secondary | ICD-10-CM

## 2021-04-03 NOTE — Telephone Encounter (Signed)
Jordanna with [facility] left a voicemail stating while going through patient's records it shows that she is on a statin medication. Laurice Record stated that she needs a call back with a diagnosis code for hyperlipidemia.

## 2021-04-04 DIAGNOSIS — E1169 Type 2 diabetes mellitus with other specified complication: Secondary | ICD-10-CM | POA: Insufficient documentation

## 2021-04-04 NOTE — Telephone Encounter (Signed)
Called and gave information to [place]. No further action at this time.

## 2021-04-04 NOTE — Telephone Encounter (Signed)
She is managed on rosuvastatin for hyperlipidemia associated with type 2 diabetes.  Statin therapy in the setting of type 2 diabetes is a standard of care with strong recommendations.  Code I am seeing is E11.69

## 2021-04-05 DIAGNOSIS — E119 Type 2 diabetes mellitus without complications: Secondary | ICD-10-CM | POA: Diagnosis not present

## 2021-04-05 DIAGNOSIS — S42252D Displaced fracture of greater tuberosity of left humerus, subsequent encounter for fracture with routine healing: Secondary | ICD-10-CM | POA: Diagnosis not present

## 2021-04-05 DIAGNOSIS — I10 Essential (primary) hypertension: Secondary | ICD-10-CM | POA: Diagnosis not present

## 2021-04-05 DIAGNOSIS — E785 Hyperlipidemia, unspecified: Secondary | ICD-10-CM | POA: Diagnosis not present

## 2021-04-05 DIAGNOSIS — Z9181 History of falling: Secondary | ICD-10-CM | POA: Diagnosis not present

## 2021-04-05 DIAGNOSIS — Z7982 Long term (current) use of aspirin: Secondary | ICD-10-CM | POA: Diagnosis not present

## 2021-04-11 DIAGNOSIS — E785 Hyperlipidemia, unspecified: Secondary | ICD-10-CM | POA: Diagnosis not present

## 2021-04-11 DIAGNOSIS — S42252D Displaced fracture of greater tuberosity of left humerus, subsequent encounter for fracture with routine healing: Secondary | ICD-10-CM | POA: Diagnosis not present

## 2021-04-11 DIAGNOSIS — Z9181 History of falling: Secondary | ICD-10-CM | POA: Diagnosis not present

## 2021-04-11 DIAGNOSIS — Z7982 Long term (current) use of aspirin: Secondary | ICD-10-CM | POA: Diagnosis not present

## 2021-04-11 DIAGNOSIS — I10 Essential (primary) hypertension: Secondary | ICD-10-CM | POA: Diagnosis not present

## 2021-04-11 DIAGNOSIS — E119 Type 2 diabetes mellitus without complications: Secondary | ICD-10-CM | POA: Diagnosis not present

## 2021-04-12 DIAGNOSIS — I10 Essential (primary) hypertension: Secondary | ICD-10-CM | POA: Diagnosis not present

## 2021-04-12 DIAGNOSIS — Z7982 Long term (current) use of aspirin: Secondary | ICD-10-CM | POA: Diagnosis not present

## 2021-04-12 DIAGNOSIS — Z9181 History of falling: Secondary | ICD-10-CM | POA: Diagnosis not present

## 2021-04-12 DIAGNOSIS — S42252D Displaced fracture of greater tuberosity of left humerus, subsequent encounter for fracture with routine healing: Secondary | ICD-10-CM | POA: Diagnosis not present

## 2021-04-12 DIAGNOSIS — E785 Hyperlipidemia, unspecified: Secondary | ICD-10-CM | POA: Diagnosis not present

## 2021-04-12 DIAGNOSIS — E119 Type 2 diabetes mellitus without complications: Secondary | ICD-10-CM | POA: Diagnosis not present

## 2021-04-16 DIAGNOSIS — E785 Hyperlipidemia, unspecified: Secondary | ICD-10-CM | POA: Diagnosis not present

## 2021-04-16 DIAGNOSIS — I10 Essential (primary) hypertension: Secondary | ICD-10-CM | POA: Diagnosis not present

## 2021-04-16 DIAGNOSIS — E119 Type 2 diabetes mellitus without complications: Secondary | ICD-10-CM | POA: Diagnosis not present

## 2021-04-16 DIAGNOSIS — Z7982 Long term (current) use of aspirin: Secondary | ICD-10-CM | POA: Diagnosis not present

## 2021-04-16 DIAGNOSIS — S42252D Displaced fracture of greater tuberosity of left humerus, subsequent encounter for fracture with routine healing: Secondary | ICD-10-CM | POA: Diagnosis not present

## 2021-04-16 DIAGNOSIS — Z9181 History of falling: Secondary | ICD-10-CM | POA: Diagnosis not present

## 2021-04-18 DIAGNOSIS — S42252D Displaced fracture of greater tuberosity of left humerus, subsequent encounter for fracture with routine healing: Secondary | ICD-10-CM | POA: Diagnosis not present

## 2021-04-18 DIAGNOSIS — S42255A Nondisplaced fracture of greater tuberosity of left humerus, initial encounter for closed fracture: Secondary | ICD-10-CM | POA: Diagnosis not present

## 2021-04-18 DIAGNOSIS — Z9181 History of falling: Secondary | ICD-10-CM | POA: Diagnosis not present

## 2021-04-18 DIAGNOSIS — Z7982 Long term (current) use of aspirin: Secondary | ICD-10-CM | POA: Diagnosis not present

## 2021-04-18 DIAGNOSIS — E119 Type 2 diabetes mellitus without complications: Secondary | ICD-10-CM | POA: Diagnosis not present

## 2021-04-18 DIAGNOSIS — I10 Essential (primary) hypertension: Secondary | ICD-10-CM | POA: Diagnosis not present

## 2021-04-18 DIAGNOSIS — E785 Hyperlipidemia, unspecified: Secondary | ICD-10-CM | POA: Diagnosis not present

## 2021-04-19 DIAGNOSIS — E119 Type 2 diabetes mellitus without complications: Secondary | ICD-10-CM | POA: Diagnosis not present

## 2021-04-19 DIAGNOSIS — E785 Hyperlipidemia, unspecified: Secondary | ICD-10-CM | POA: Diagnosis not present

## 2021-04-19 DIAGNOSIS — S42252D Displaced fracture of greater tuberosity of left humerus, subsequent encounter for fracture with routine healing: Secondary | ICD-10-CM | POA: Diagnosis not present

## 2021-04-19 DIAGNOSIS — Z7982 Long term (current) use of aspirin: Secondary | ICD-10-CM | POA: Diagnosis not present

## 2021-04-19 DIAGNOSIS — Z9181 History of falling: Secondary | ICD-10-CM | POA: Diagnosis not present

## 2021-04-19 DIAGNOSIS — I10 Essential (primary) hypertension: Secondary | ICD-10-CM | POA: Diagnosis not present

## 2021-04-21 IMAGING — MG DIGITAL SCREENING BILAT W/ TOMO W/ CAD
8 series · 8 of 24 positions shown · non-contrast
Comparison: Previous exam(s).

CLINICAL DATA: Screening.

EXAM:
DIGITAL SCREENING BILATERAL MAMMOGRAM WITH TOMO AND CAD

[R CC synth-2D]
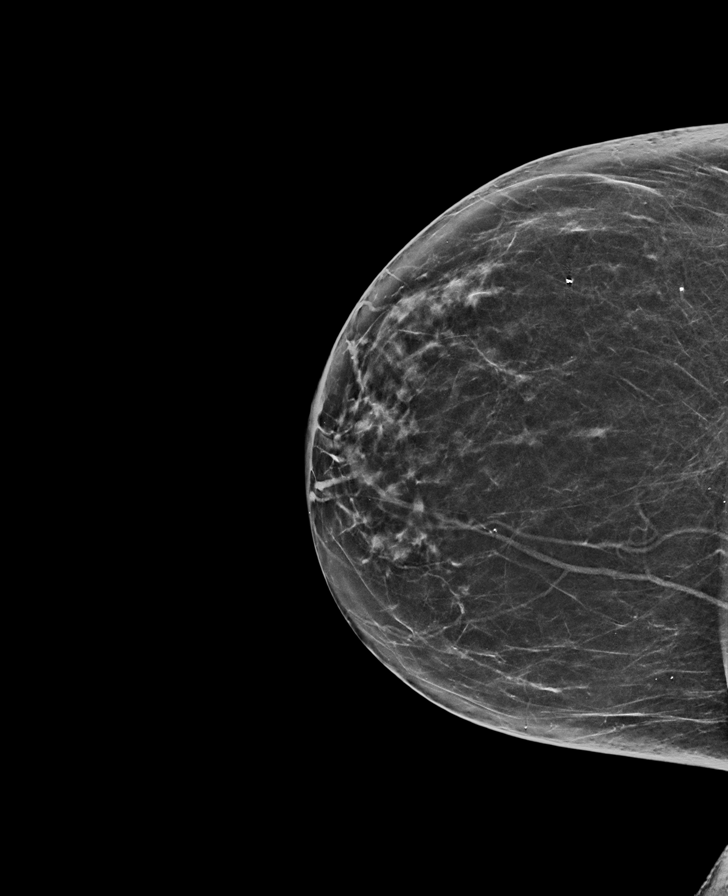

[L CC synth-2D]
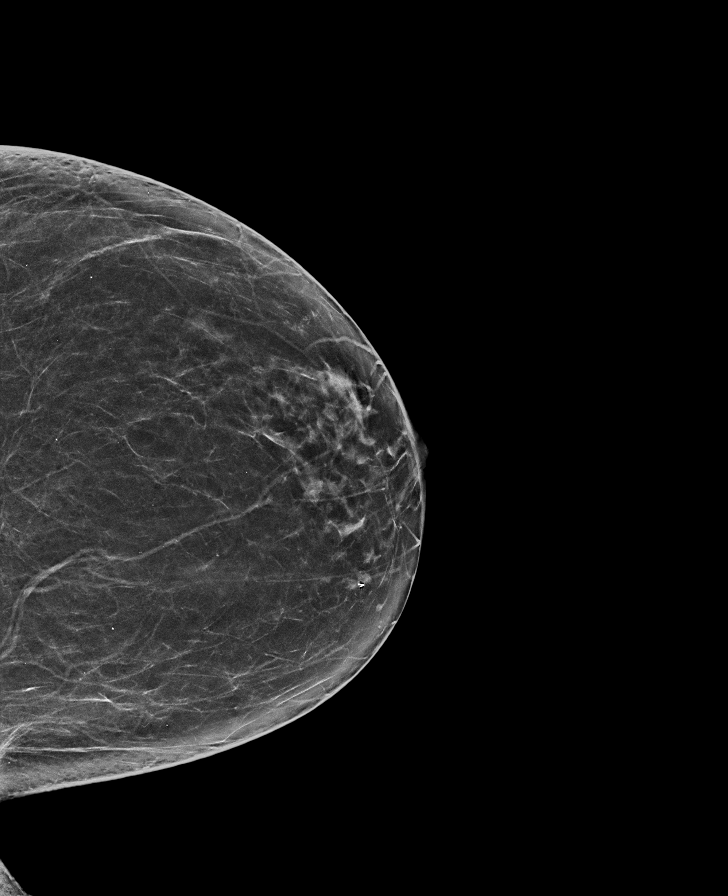

[L MLO synth-2D]
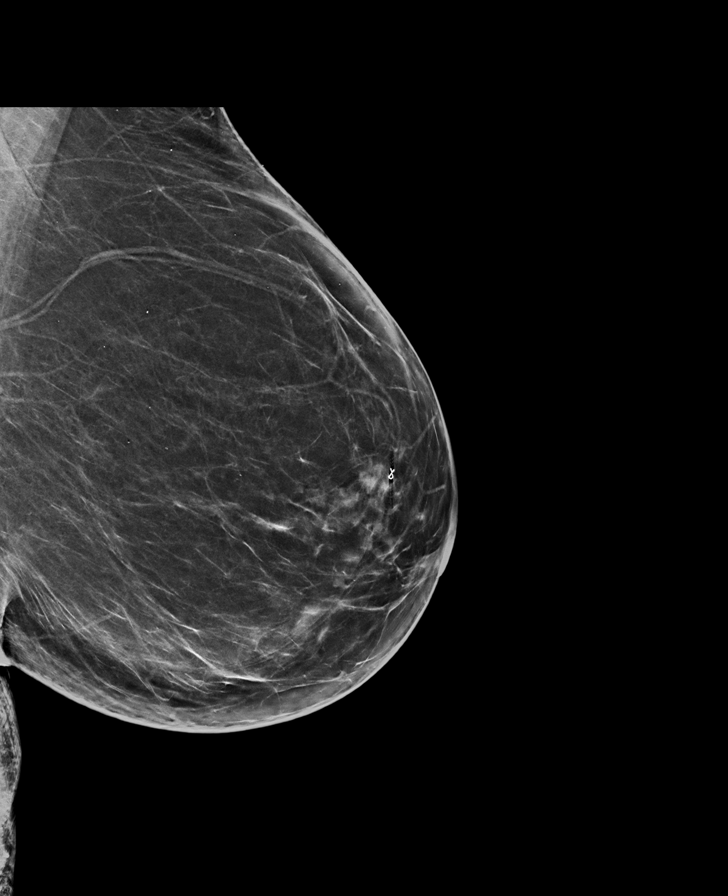

[R MLO synth-2D]
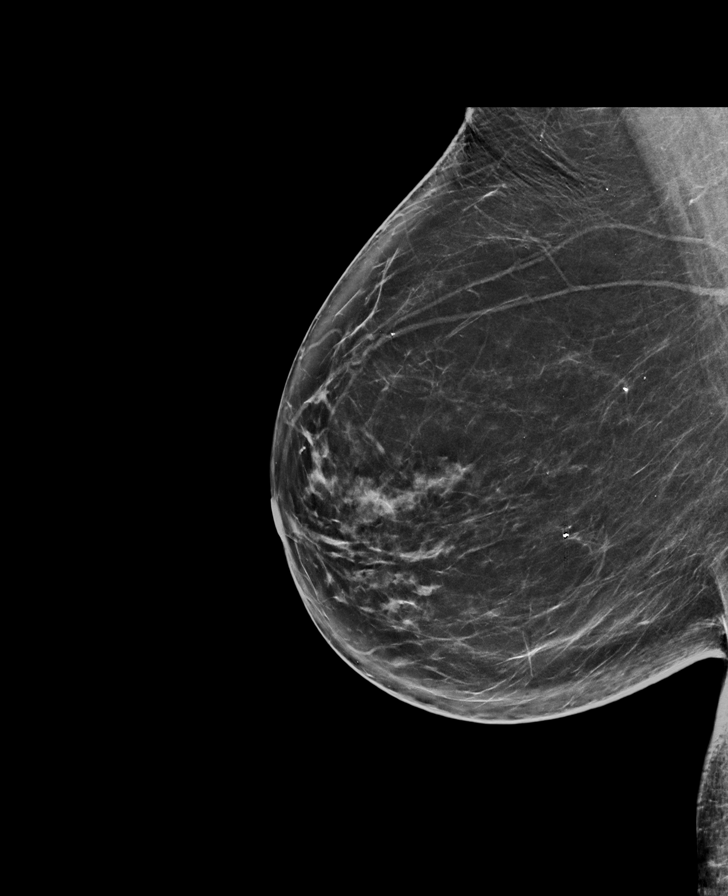

[R MLO tomo · tomo slice 39/76.0]
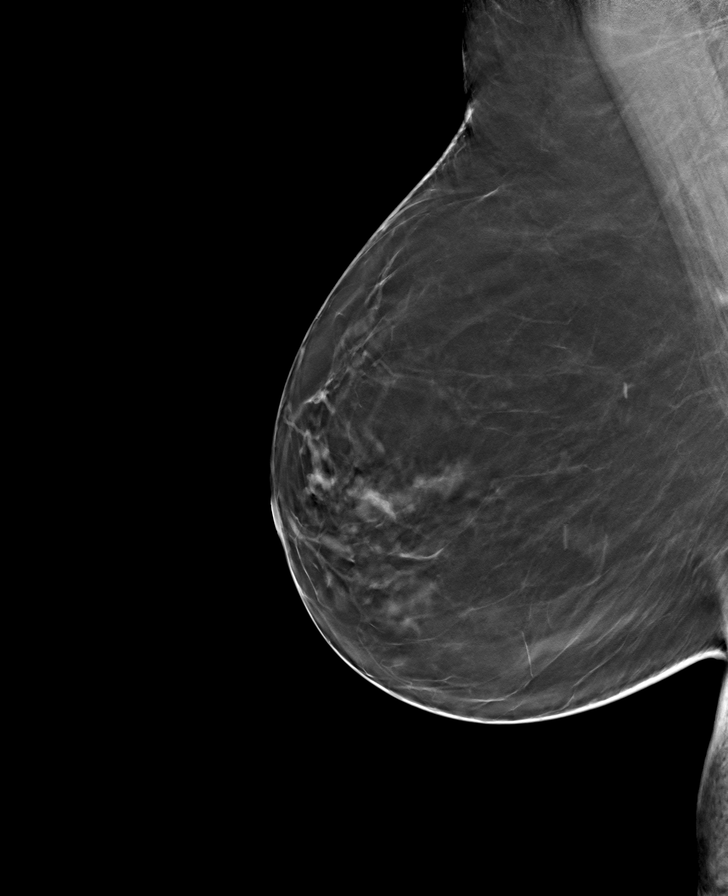

[L MLO tomo · tomo slice 37/73.0]
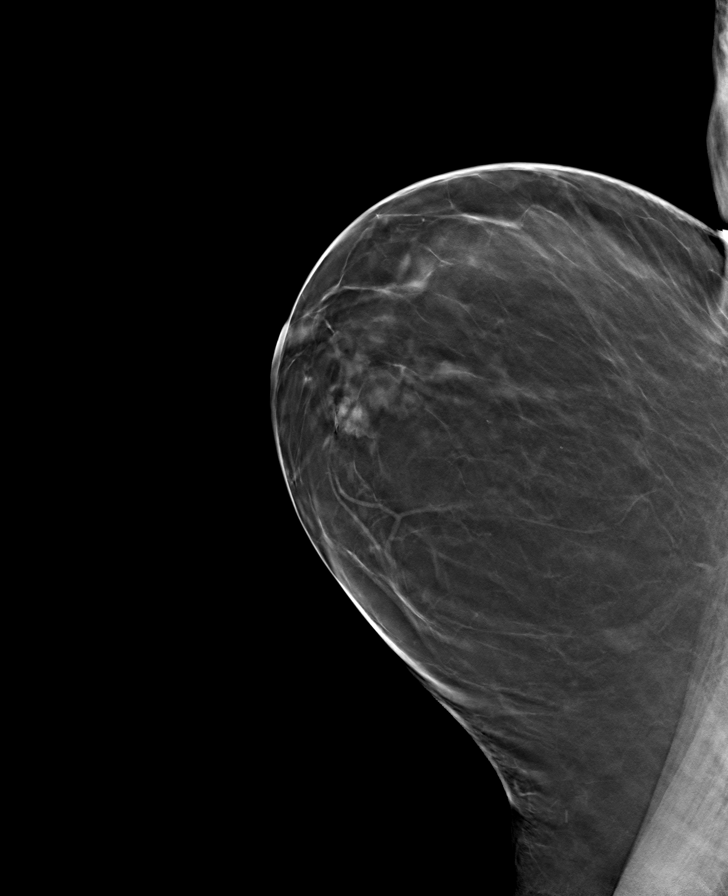

[L CC tomo · tomo slice 33/66.0]
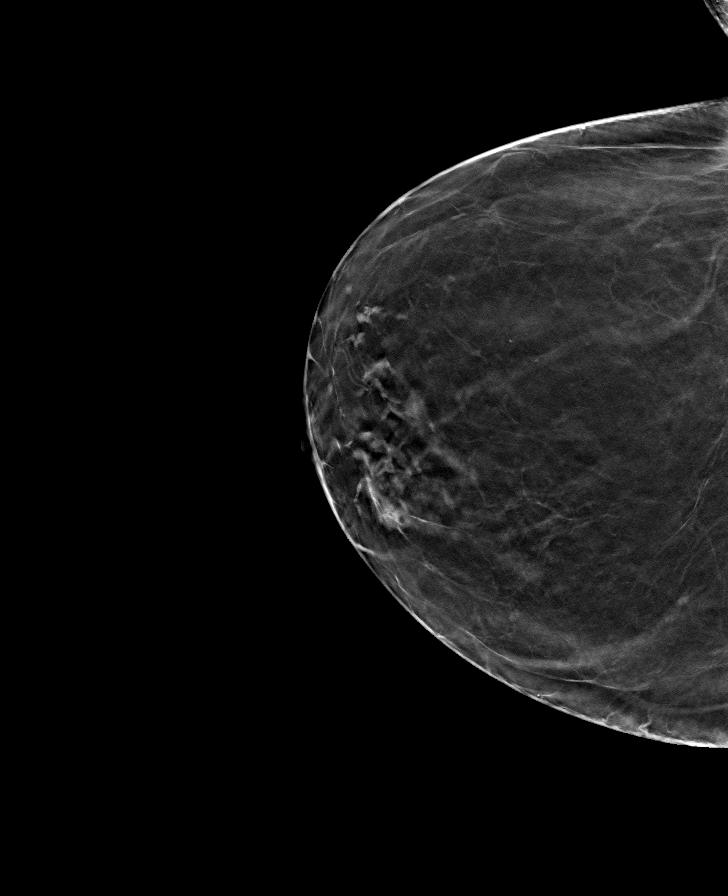

[R CC tomo · tomo slice 33/64.0]
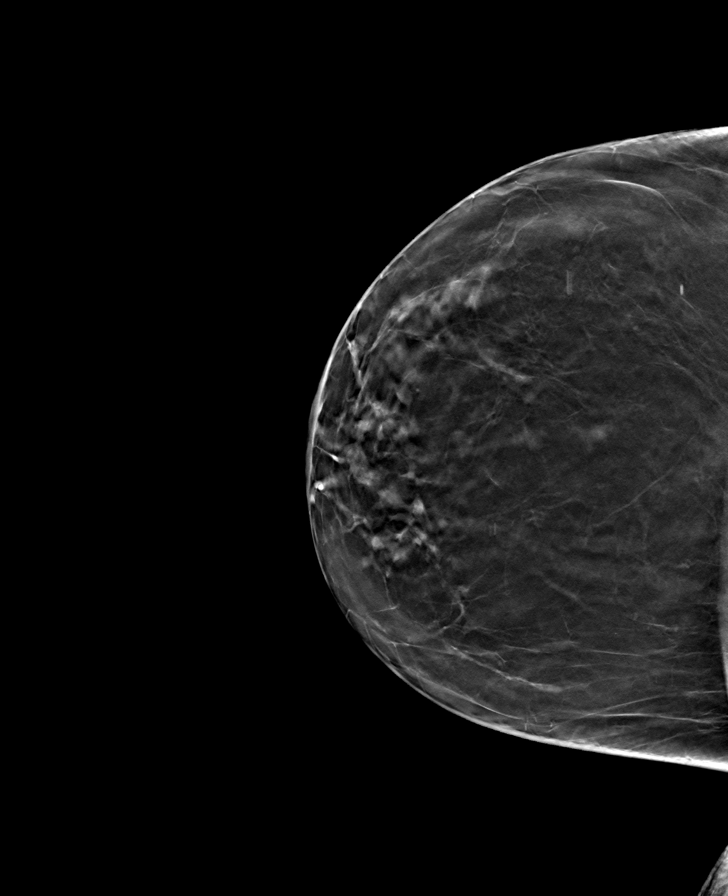

[8 of 24 positions shown; findings below may reference images not displayed]

ACR Breast Density Category b: There are scattered areas of
fibroglandular density.
FINDINGS: There are no findings suspicious for malignancy. Images were
processed with CAD.
IMPRESSION: No mammographic evidence of malignancy. A result letter of this
screening mammogram will be mailed directly to the patient.

RECOMMENDATION:
Screening mammogram in one year. (Code:[id number])

BI-RADS CATEGORY  1: Negative.

## 2021-04-24 DIAGNOSIS — I10 Essential (primary) hypertension: Secondary | ICD-10-CM | POA: Diagnosis not present

## 2021-04-24 DIAGNOSIS — Z7982 Long term (current) use of aspirin: Secondary | ICD-10-CM | POA: Diagnosis not present

## 2021-04-24 DIAGNOSIS — S42252D Displaced fracture of greater tuberosity of left humerus, subsequent encounter for fracture with routine healing: Secondary | ICD-10-CM | POA: Diagnosis not present

## 2021-04-24 DIAGNOSIS — Z9181 History of falling: Secondary | ICD-10-CM | POA: Diagnosis not present

## 2021-04-24 DIAGNOSIS — E119 Type 2 diabetes mellitus without complications: Secondary | ICD-10-CM | POA: Diagnosis not present

## 2021-04-24 DIAGNOSIS — E785 Hyperlipidemia, unspecified: Secondary | ICD-10-CM | POA: Diagnosis not present

## 2021-04-25 DIAGNOSIS — I10 Essential (primary) hypertension: Secondary | ICD-10-CM | POA: Diagnosis not present

## 2021-04-25 DIAGNOSIS — E119 Type 2 diabetes mellitus without complications: Secondary | ICD-10-CM | POA: Diagnosis not present

## 2021-04-25 DIAGNOSIS — S42252D Displaced fracture of greater tuberosity of left humerus, subsequent encounter for fracture with routine healing: Secondary | ICD-10-CM | POA: Diagnosis not present

## 2021-04-25 DIAGNOSIS — E785 Hyperlipidemia, unspecified: Secondary | ICD-10-CM | POA: Diagnosis not present

## 2021-04-25 DIAGNOSIS — Z7982 Long term (current) use of aspirin: Secondary | ICD-10-CM | POA: Diagnosis not present

## 2021-04-25 DIAGNOSIS — Z9181 History of falling: Secondary | ICD-10-CM | POA: Diagnosis not present

## 2021-04-26 DIAGNOSIS — Z7982 Long term (current) use of aspirin: Secondary | ICD-10-CM | POA: Diagnosis not present

## 2021-04-26 DIAGNOSIS — I10 Essential (primary) hypertension: Secondary | ICD-10-CM | POA: Diagnosis not present

## 2021-04-26 DIAGNOSIS — Z9181 History of falling: Secondary | ICD-10-CM | POA: Diagnosis not present

## 2021-04-26 DIAGNOSIS — E119 Type 2 diabetes mellitus without complications: Secondary | ICD-10-CM | POA: Diagnosis not present

## 2021-04-26 DIAGNOSIS — S42252D Displaced fracture of greater tuberosity of left humerus, subsequent encounter for fracture with routine healing: Secondary | ICD-10-CM | POA: Diagnosis not present

## 2021-04-26 DIAGNOSIS — E785 Hyperlipidemia, unspecified: Secondary | ICD-10-CM | POA: Diagnosis not present

## 2021-04-27 DIAGNOSIS — S42252D Displaced fracture of greater tuberosity of left humerus, subsequent encounter for fracture with routine healing: Secondary | ICD-10-CM | POA: Diagnosis not present

## 2021-04-27 DIAGNOSIS — I10 Essential (primary) hypertension: Secondary | ICD-10-CM | POA: Diagnosis not present

## 2021-04-27 DIAGNOSIS — E119 Type 2 diabetes mellitus without complications: Secondary | ICD-10-CM | POA: Diagnosis not present

## 2021-04-27 DIAGNOSIS — Z7982 Long term (current) use of aspirin: Secondary | ICD-10-CM | POA: Diagnosis not present

## 2021-04-27 DIAGNOSIS — E785 Hyperlipidemia, unspecified: Secondary | ICD-10-CM | POA: Diagnosis not present

## 2021-04-27 DIAGNOSIS — Z9181 History of falling: Secondary | ICD-10-CM | POA: Diagnosis not present

## 2021-04-30 DIAGNOSIS — I10 Essential (primary) hypertension: Secondary | ICD-10-CM | POA: Diagnosis not present

## 2021-04-30 DIAGNOSIS — Z9181 History of falling: Secondary | ICD-10-CM | POA: Diagnosis not present

## 2021-04-30 DIAGNOSIS — E785 Hyperlipidemia, unspecified: Secondary | ICD-10-CM | POA: Diagnosis not present

## 2021-04-30 DIAGNOSIS — S42252D Displaced fracture of greater tuberosity of left humerus, subsequent encounter for fracture with routine healing: Secondary | ICD-10-CM | POA: Diagnosis not present

## 2021-04-30 DIAGNOSIS — Z7982 Long term (current) use of aspirin: Secondary | ICD-10-CM | POA: Diagnosis not present

## 2021-04-30 DIAGNOSIS — E119 Type 2 diabetes mellitus without complications: Secondary | ICD-10-CM | POA: Diagnosis not present

## 2021-05-01 ENCOUNTER — Ambulatory Visit (INDEPENDENT_AMBULATORY_CARE_PROVIDER_SITE_OTHER): Payer: Medicare Other | Admitting: Primary Care

## 2021-05-01 ENCOUNTER — Encounter: Payer: Self-pay | Admitting: Primary Care

## 2021-05-01 ENCOUNTER — Telehealth: Payer: Self-pay

## 2021-05-01 ENCOUNTER — Other Ambulatory Visit: Payer: Self-pay

## 2021-05-01 VITALS — BP 126/78 | HR 78 | Temp 96.7°F | Ht 65.0 in | Wt 176.0 lb

## 2021-05-01 DIAGNOSIS — I10 Essential (primary) hypertension: Secondary | ICD-10-CM

## 2021-05-01 DIAGNOSIS — E1169 Type 2 diabetes mellitus with other specified complication: Secondary | ICD-10-CM

## 2021-05-01 DIAGNOSIS — E785 Hyperlipidemia, unspecified: Secondary | ICD-10-CM

## 2021-05-01 DIAGNOSIS — E119 Type 2 diabetes mellitus without complications: Secondary | ICD-10-CM | POA: Diagnosis not present

## 2021-05-01 DIAGNOSIS — H409 Unspecified glaucoma: Secondary | ICD-10-CM | POA: Diagnosis not present

## 2021-05-01 LAB — LIPID PANEL
Cholesterol: 158 mg/dL (ref 0–200)
HDL: 66 mg/dL (ref >39.00)
LDL Cholesterol: 73 mg/dL (ref 0–99)
NonHDL: 92.03
Total CHOL/HDL Ratio: 2
Triglycerides: 96 mg/dL (ref 0.0–149.0)
VLDL: 19.2 mg/dL (ref 0.0–40.0)

## 2021-05-01 LAB — COMPREHENSIVE METABOLIC PANEL
ALT: 18 U/L (ref 0–35)
AST: 15 U/L (ref 0–37)
Albumin: 4.3 g/dL (ref 3.5–5.2)
Alkaline Phosphatase: 112 U/L (ref 39–117)
BUN: 12 mg/dL (ref 6–23)
CO2: 31 mEq/L (ref 19–32)
Calcium: 9.3 mg/dL (ref 8.4–10.5)
Chloride: 100 mEq/L (ref 96–112)
Creatinine, Ser: 0.69 mg/dL (ref 0.40–1.20)
GFR: 85.23 mL/min (ref >60.00)
Glucose, Bld: 123 mg/dL — ABNORMAL HIGH (ref 70–99)
Potassium: 3.7 mEq/L (ref 3.5–5.1)
Sodium: 139 mEq/L (ref 135–145)
Total Bilirubin: 0.4 mg/dL (ref 0.2–1.2)
Total Protein: 7.4 g/dL (ref 6.0–8.3)

## 2021-05-01 LAB — POCT GLYCOSYLATED HEMOGLOBIN (HGB A1C): Hemoglobin A1C: 6.2 % — AB (ref 4.0–5.6)

## 2021-05-01 NOTE — Assessment & Plan Note (Signed)
Well controlled in the office today with A1C of 6.2. Continue off medications.   Managed on statin and ARB. Foot and eye exam UTD.  Follow up in 6 months.

## 2021-05-01 NOTE — Patient Instructions (Addendum)
Please schedule a follow up visit for 6 months.  It was a pleasure to see you today!

## 2021-05-01 NOTE — Telephone Encounter (Signed)
[place] Night - Client Nonclinical Telephone Record  [facility] Client [facility] Night - Client Client Site [place] Physician Alma Friendly - NP Contact Type Call Who Is Calling Patient / Member / Family / Caregiver Caller Name [name] Phone Number [phone] Patient Name Kim Holloway Patient DOB 1946-03-29 Call Type Message Only Information Provided Reason for Call Request for General Office Information Initial Comment Caller states that she needs to confirm appointment for tomorrow the 26th at 9 AM. She will be at the appointment on time. Disp. Time Disposition Final User 04/30/2021 5:35:29 PM General Information Provided Yes Lawernce Keas Call Closed By: Lawernce Keas Transaction Date/Time: 04/30/2021 5:32:32 PM (ET)   Pt has made it to apt today

## 2021-05-01 NOTE — Assessment & Plan Note (Signed)
Controlled in the office today, continue irbesartan-HCTZ 150-12.5 mg daily. CMP pending.

## 2021-05-01 NOTE — Assessment & Plan Note (Signed)
Compliant to rosuvastatin 5 mg, continue same. Repeat lipid panel pending.

## 2021-05-01 NOTE — Assessment & Plan Note (Signed)
Following regularly with ophthalmology,

## 2021-05-01 NOTE — Progress Notes (Signed)
Subjective:    Patient ID: Kim Holloway, female    DOB: 1946/04/03, 75 y.o.   MRN: [id number]  HPI  Kim Holloway is a very pleasant 75 y.o. female with a history of type 2 diabetes, hyperlipidemia, hypertension who presents today for follow up of chronic conditions.   History of type 2 diabetes, not currently on treatment. A1C today of 6.2 which is about the same during her visit 6 months ago. She does not check her blood sugars at home. She endorses a poor diet, is not exercising much.   She is compliant to her irbestartan-HCTZ 150-12.5 mg for hypertension.  BP Readings from Last 3 Encounters:  05/01/21 126/78  03/15/21 (!) 170/87  10/31/20 126/78   She is compliant to her rosuvastatin 5 mg. She will be due for repeat labs soon.   Review of Systems  Respiratory:  Negative for shortness of breath.   Cardiovascular:  Negative for chest pain.  Neurological:  Negative for dizziness and numbness.        Past Medical History:  Diagnosis Date   Abnormal breast biopsy 10/04/2013   right breast   Family history of adverse reaction to anesthesia 2016   brother had difficulty waking up after surgery   History of colonoscopy 2008   Hypertension    Mammographic calcification 06/2017   Proximal humerus fracture 03/15/2021   Left    Social History   Socioeconomic History   Marital status: Married    Spouse name: Not on file   Number of children: Not on file   Years of education: Not on file   Highest education level: Not on file  Occupational History   Not on file  Tobacco Use   Smoking status: Never   Smokeless tobacco: Never  Vaping Use   Vaping Use: Never used  Substance and Sexual Activity   Alcohol use: No   Drug use: No   Sexual activity: Not on file  Other Topics Concern   Not on file  Social History Narrative   Married.   Moved from [place]. Originally from [place].   Retired.      Social Determinants of Health   Financial Resource Strain: Not on file   Food Insecurity: Not on file  Transportation Needs: Not on file  Physical Activity: Not on file  Stress: Not on file  Social Connections: Not on file  Intimate Partner Violence: Not on file    Past Surgical History:  Procedure Laterality Date   BREAST BIOPSY Right 2014   stereo bx benign calcs   BREAST BIOPSY Left ?   cyst   CATARACT EXTRACTION  01/19/2016, 02/19/2016   COLONOSCOPY  2008   CYST EXCISION Right 07/31/2017   Procedure: CYST REMOVAL RIGHT INDEX FINGER;  Surgeon: Hessie Knows, MD;  Location: [facility] ORS;  Service: Orthopedics;  Laterality: Right;   EYE SURGERY Left 12/2016   laser surgery    Family History  Problem Relation Age of Onset   Breast cancer Neg Hx     No Known Allergies  Current Outpatient Medications on File Prior to Visit  Medication Sig Dispense Refill   aspirin EC 81 MG tablet Take 81 mg by mouth daily.     Calcium Carb-Cholecalciferol (CALCIUM 600 + D PO) Take 2 tablets by mouth daily.     cholecalciferol (VITAMIN D) 1000 units tablet Take 1,000 Units by mouth daily.     irbesartan-hydrochlorothiazide (AVALIDE) 150-12.5 MG tablet TAKE 1 TABLET BY MOUTH ONCE DAILY FOR HIGH  BLOOD  PRESSURE 90 tablet 3   rosuvastatin (CRESTOR) 5 MG tablet TAKE 1 TABLET BY MOUTH IN  THE EVENING FOR CHOLESTEROL 90 tablet 3   No current facility-administered medications on file prior to visit.    BP 126/78   Pulse 78   Temp (!) 96.7 F (35.9 C) (Temporal)   Ht '5\' 5"'$  (1.651 m)   Wt 176 lb (79.8 kg)   SpO2 99%   BMI 29.29 kg/m  Objective:   Physical Exam Cardiovascular:     Rate and Rhythm: Normal rate and regular rhythm.  Pulmonary:     Effort: Pulmonary effort is normal.     Breath sounds: Normal breath sounds.  Musculoskeletal:     Cervical back: Neck supple.  Skin:    General: Skin is warm and dry.          Assessment & Plan:      This visit occurred during the SARS-CoV-2 public health emergency.  Safety protocols were in place, including  screening questions prior to the visit, additional usage of staff PPE, and extensive cleaning of exam room while observing appropriate contact time as indicated for disinfecting solutions.

## 2021-05-03 DIAGNOSIS — I10 Essential (primary) hypertension: Secondary | ICD-10-CM | POA: Diagnosis not present

## 2021-05-03 DIAGNOSIS — E119 Type 2 diabetes mellitus without complications: Secondary | ICD-10-CM | POA: Diagnosis not present

## 2021-05-03 DIAGNOSIS — E785 Hyperlipidemia, unspecified: Secondary | ICD-10-CM | POA: Diagnosis not present

## 2021-05-03 DIAGNOSIS — Z9181 History of falling: Secondary | ICD-10-CM | POA: Diagnosis not present

## 2021-05-03 DIAGNOSIS — Z7982 Long term (current) use of aspirin: Secondary | ICD-10-CM | POA: Diagnosis not present

## 2021-05-03 DIAGNOSIS — S42252D Displaced fracture of greater tuberosity of left humerus, subsequent encounter for fracture with routine healing: Secondary | ICD-10-CM | POA: Diagnosis not present

## 2021-05-07 DIAGNOSIS — E119 Type 2 diabetes mellitus without complications: Secondary | ICD-10-CM | POA: Diagnosis not present

## 2021-05-07 DIAGNOSIS — Z7982 Long term (current) use of aspirin: Secondary | ICD-10-CM | POA: Diagnosis not present

## 2021-05-07 DIAGNOSIS — S42252D Displaced fracture of greater tuberosity of left humerus, subsequent encounter for fracture with routine healing: Secondary | ICD-10-CM | POA: Diagnosis not present

## 2021-05-07 DIAGNOSIS — Z9181 History of falling: Secondary | ICD-10-CM | POA: Diagnosis not present

## 2021-05-07 DIAGNOSIS — I10 Essential (primary) hypertension: Secondary | ICD-10-CM | POA: Diagnosis not present

## 2021-05-07 DIAGNOSIS — E785 Hyperlipidemia, unspecified: Secondary | ICD-10-CM | POA: Diagnosis not present

## 2021-05-09 DIAGNOSIS — I10 Essential (primary) hypertension: Secondary | ICD-10-CM | POA: Diagnosis not present

## 2021-05-09 DIAGNOSIS — E119 Type 2 diabetes mellitus without complications: Secondary | ICD-10-CM | POA: Diagnosis not present

## 2021-05-09 DIAGNOSIS — E785 Hyperlipidemia, unspecified: Secondary | ICD-10-CM | POA: Diagnosis not present

## 2021-05-09 DIAGNOSIS — S42252D Displaced fracture of greater tuberosity of left humerus, subsequent encounter for fracture with routine healing: Secondary | ICD-10-CM | POA: Diagnosis not present

## 2021-05-09 DIAGNOSIS — Z9181 History of falling: Secondary | ICD-10-CM | POA: Diagnosis not present

## 2021-05-09 DIAGNOSIS — Z7982 Long term (current) use of aspirin: Secondary | ICD-10-CM | POA: Diagnosis not present

## 2021-05-10 ENCOUNTER — Other Ambulatory Visit: Payer: Self-pay | Admitting: Primary Care

## 2021-05-10 DIAGNOSIS — Z1231 Encounter for screening mammogram for malignant neoplasm of breast: Secondary | ICD-10-CM

## 2021-05-14 DIAGNOSIS — Z9181 History of falling: Secondary | ICD-10-CM | POA: Diagnosis not present

## 2021-05-14 DIAGNOSIS — E785 Hyperlipidemia, unspecified: Secondary | ICD-10-CM | POA: Diagnosis not present

## 2021-05-14 DIAGNOSIS — I10 Essential (primary) hypertension: Secondary | ICD-10-CM | POA: Diagnosis not present

## 2021-05-14 DIAGNOSIS — S42252D Displaced fracture of greater tuberosity of left humerus, subsequent encounter for fracture with routine healing: Secondary | ICD-10-CM | POA: Diagnosis not present

## 2021-05-14 DIAGNOSIS — Z7982 Long term (current) use of aspirin: Secondary | ICD-10-CM | POA: Diagnosis not present

## 2021-05-14 DIAGNOSIS — E119 Type 2 diabetes mellitus without complications: Secondary | ICD-10-CM | POA: Diagnosis not present

## 2021-05-16 DIAGNOSIS — Z7982 Long term (current) use of aspirin: Secondary | ICD-10-CM | POA: Diagnosis not present

## 2021-05-16 DIAGNOSIS — Z9181 History of falling: Secondary | ICD-10-CM | POA: Diagnosis not present

## 2021-05-16 DIAGNOSIS — E119 Type 2 diabetes mellitus without complications: Secondary | ICD-10-CM | POA: Diagnosis not present

## 2021-05-16 DIAGNOSIS — S42252D Displaced fracture of greater tuberosity of left humerus, subsequent encounter for fracture with routine healing: Secondary | ICD-10-CM | POA: Diagnosis not present

## 2021-05-16 DIAGNOSIS — E785 Hyperlipidemia, unspecified: Secondary | ICD-10-CM | POA: Diagnosis not present

## 2021-05-16 DIAGNOSIS — I10 Essential (primary) hypertension: Secondary | ICD-10-CM | POA: Diagnosis not present

## 2021-05-22 DIAGNOSIS — I10 Essential (primary) hypertension: Secondary | ICD-10-CM | POA: Diagnosis not present

## 2021-05-22 DIAGNOSIS — Z7982 Long term (current) use of aspirin: Secondary | ICD-10-CM | POA: Diagnosis not present

## 2021-05-22 DIAGNOSIS — Z9181 History of falling: Secondary | ICD-10-CM | POA: Diagnosis not present

## 2021-05-22 DIAGNOSIS — E119 Type 2 diabetes mellitus without complications: Secondary | ICD-10-CM | POA: Diagnosis not present

## 2021-05-22 DIAGNOSIS — E785 Hyperlipidemia, unspecified: Secondary | ICD-10-CM | POA: Diagnosis not present

## 2021-05-22 DIAGNOSIS — S42252D Displaced fracture of greater tuberosity of left humerus, subsequent encounter for fracture with routine healing: Secondary | ICD-10-CM | POA: Diagnosis not present

## 2021-05-31 DIAGNOSIS — S42255A Nondisplaced fracture of greater tuberosity of left humerus, initial encounter for closed fracture: Secondary | ICD-10-CM | POA: Diagnosis not present

## 2021-06-12 ENCOUNTER — Ambulatory Visit
Admission: RE | Admit: 2021-06-12 | Discharge: 2021-06-12 | Disposition: A | Discharge status: Home / Self Care | Payer: Medicare Other | Source: Ambulatory Visit | Attending: Primary Care | Admitting: Primary Care

## 2021-06-12 ENCOUNTER — Other Ambulatory Visit: Payer: Self-pay

## 2021-06-12 DIAGNOSIS — Z1231 Encounter for screening mammogram for malignant neoplasm of breast: Secondary | ICD-10-CM | POA: Diagnosis not present

## 2021-08-11 ENCOUNTER — Other Ambulatory Visit: Payer: Self-pay | Admitting: Primary Care

## 2021-08-11 DIAGNOSIS — I10 Essential (primary) hypertension: Secondary | ICD-10-CM

## 2021-09-10 DIAGNOSIS — H40003 Preglaucoma, unspecified, bilateral: Secondary | ICD-10-CM | POA: Diagnosis not present

## 2021-11-01 ENCOUNTER — Other Ambulatory Visit: Payer: Self-pay

## 2021-11-01 ENCOUNTER — Ambulatory Visit (INDEPENDENT_AMBULATORY_CARE_PROVIDER_SITE_OTHER): Payer: Medicare Other | Admitting: Primary Care

## 2021-11-01 ENCOUNTER — Encounter: Payer: Self-pay | Admitting: Primary Care

## 2021-11-01 VITALS — BP 124/78 | HR 74 | Temp 97.7°F | Ht 65.0 in | Wt 172.0 lb

## 2021-11-01 DIAGNOSIS — E119 Type 2 diabetes mellitus without complications: Secondary | ICD-10-CM | POA: Diagnosis not present

## 2021-11-01 DIAGNOSIS — B351 Tinea unguium: Secondary | ICD-10-CM

## 2021-11-01 LAB — POCT GLYCOSYLATED HEMOGLOBIN (HGB A1C): Hemoglobin A1C: 6.6 % — AB (ref 4.0–5.6)

## 2021-11-01 NOTE — Progress Notes (Signed)
Subjective:    Patient ID: Kim Holloway, female    DOB: 06-Jul-1946, 76 y.o.   MRN: [id number]  HPI  Kim Holloway is a very pleasant 76 y.o. female with a history of hypertension, type 2 diabetes, hyperlipidemia, glaucoma who presents today for follow up of diabetes. She would also like to discuss toenail fungus.   1) Type 2 Diabetes:  Current medications include: None   Last A1C: 6.2 in July 2022, 6.6 today  Last Eye Exam: UTD Last Foot Exam: Due Pneumonia Vaccination: 2016 Urine Microalbumin: None. Managed on ARB Statin: rosuvastatin   Dietary changes since last visit: Unhealthy diet during the holidays. Back on track now.    Exercise: She is walking 3 miles daily weather permitting.   BP Readings from Last 3 Encounters:  11/01/21 124/78  05/01/21 126/78  03/15/21 (!) 170/87   2) Toenail Fungus:   Chronic to bilateral great toes for years, gradually worse to right great toenail. No prior history of toenail fungus. She's tried vicks vapor rub without improvement. She began using an OTC toenail fungus treatment for one week, hasn't seen much improvement.    Review of Systems  Eyes:  Negative for visual disturbance.  Respiratory:  Negative for shortness of breath.   Cardiovascular:  Negative for chest pain.  Neurological:  Negative for dizziness and numbness.        Past Medical History:  Diagnosis Date   Abnormal breast biopsy 10/04/2013   right breast   Family history of adverse reaction to anesthesia 2016   brother had difficulty waking up after surgery   History of colonoscopy 2008   Hypertension    Mammographic calcification 06/2017   Proximal humerus fracture 03/15/2021   Left    Social History   Socioeconomic History   Marital status: Married    Spouse name: Not on file   Number of children: Not on file   Years of education: Not on file   Highest education level: Not on file  Occupational History   Not on file  Tobacco Use   Smoking status:  Never   Smokeless tobacco: Never  Vaping Use   Vaping Use: Never used  Substance and Sexual Activity   Alcohol use: No   Drug use: No   Sexual activity: Not on file  Other Topics Concern   Not on file  Social History Narrative   Married.   Moved from [place]. Originally from [place].   Retired.      Social Determinants of Health   Financial Resource Strain: Not on file  Food Insecurity: Not on file  Transportation Needs: Not on file  Physical Activity: Not on file  Stress: Not on file  Social Connections: Not on file  Intimate Partner Violence: Not on file    Past Surgical History:  Procedure Laterality Date   BREAST BIOPSY Right 2014   stereo bx benign calcs   BREAST BIOPSY Left ?   cyst   CATARACT EXTRACTION  01/19/2016, 02/19/2016   COLONOSCOPY  2008   CYST EXCISION Right 07/31/2017   Procedure: CYST REMOVAL RIGHT INDEX FINGER;  Surgeon: Hessie Knows, MD;  Location: [facility] ORS;  Service: Orthopedics;  Laterality: Right;   EYE SURGERY Left 12/2016   laser surgery    Family History  Problem Relation Age of Onset   Breast cancer Neg Hx     No Known Allergies  Current Outpatient Medications on File Prior to Visit  Medication Sig Dispense Refill   aspirin EC  81 MG tablet Take 81 mg by mouth daily.     Calcium Carb-Cholecalciferol (CALCIUM 600 + D PO) Take 2 tablets by mouth daily.     cholecalciferol (VITAMIN D) 1000 units tablet Take 1,000 Units by mouth daily.     irbesartan-hydrochlorothiazide (AVALIDE) 150-12.5 MG tablet TAKE 1 TABLET BY MOUTH ONCE DAILY FOR HIGH BLOOD  PRESSURE 90 tablet 2   rosuvastatin (CRESTOR) 5 MG tablet TAKE 1 TABLET BY MOUTH IN  THE EVENING FOR CHOLESTEROL 90 tablet 3   No current facility-administered medications on file prior to visit.    BP 124/78    Pulse 74    Temp 97.7 F (36.5 C) (Temporal)    Ht 5\' 5"  (1.651 m)    Wt 172 lb (78 kg)    SpO2 96%    BMI 28.62 kg/m  Objective:   Physical Exam Cardiovascular:     Rate and Rhythm:  Normal rate and regular rhythm.  Pulmonary:     Effort: Pulmonary effort is normal.     Breath sounds: Normal breath sounds.  Musculoskeletal:     Cervical back: Neck supple.  Skin:    General: Skin is warm and dry.     Comments: Onychomycosis noted to bilateral great toenails.           Assessment & Plan:      This visit occurred during the SARS-CoV-2 public health emergency.  Safety protocols were in place, including screening questions prior to the visit, additional usage of staff PPE, and extensive cleaning of exam room while observing appropriate contact time as indicated for disinfecting solutions.

## 2021-11-01 NOTE — Assessment & Plan Note (Signed)
Slightly increased, back in diabetic range. Discussed this with patient today.  She is motivated to get back on track with her diet. We will continue off medication treatment. She will continue daily walking.  We will plan to see her back in 6 months.  Foot exam today. Managed on statin and ARB. Pneumonia vaccine UTD. Eye exam UTD

## 2021-11-01 NOTE — Patient Instructions (Signed)
Work on [employer].  Keep walking daily.   We will see you in the Summer!  It was a pleasure to see you today!

## 2021-11-01 NOTE — Assessment & Plan Note (Signed)
Noted to bilateral great toenails today.   Recommended she continue OTC treatment for an additional 3-4 weeks.   If no improvement, consider Lamisil. She will update.

## 2021-11-23 ENCOUNTER — Other Ambulatory Visit: Payer: Self-pay | Admitting: Primary Care

## 2021-11-23 DIAGNOSIS — E785 Hyperlipidemia, unspecified: Secondary | ICD-10-CM

## 2022-01-22 ENCOUNTER — Telehealth: Payer: Self-pay | Admitting: Primary Care

## 2022-01-22 NOTE — Telephone Encounter (Signed)
error 

## 2022-01-22 NOTE — Telephone Encounter (Signed)
Left message for patient to call back to schedule Medicare Annual Wellness Visit  ? ?Last AWV  07/31/20 ? ?Please schedule at anytime with [facility] if patient calls the office back.   ? ? ? ?Any questions, please call me at [phone]  ?

## 2022-02-20 ENCOUNTER — Telehealth: Payer: Self-pay | Admitting: Primary Care

## 2022-02-20 NOTE — Telephone Encounter (Signed)
Left message for patient to call back and schedule Medicare Annual Wellness Visit (AWV) either virtually or phone ? ? ?Last AWV 07/31/20 ?; please schedule at anytime with health coach ?I left my direct # [phone] ?

## 2022-02-22 ENCOUNTER — Ambulatory Visit (INDEPENDENT_AMBULATORY_CARE_PROVIDER_SITE_OTHER): Payer: Medicare Other

## 2022-02-22 VITALS — Ht 65.0 in | Wt 165.0 lb

## 2022-02-22 DIAGNOSIS — Z0000 Encounter for general adult medical examination without abnormal findings: Secondary | ICD-10-CM | POA: Diagnosis not present

## 2022-02-22 NOTE — Progress Notes (Signed)
I connected with Kim Holloway today by telephone and verified that I am speaking with the correct person using two identifiers. Location patient: home Location provider: work Persons participating in the virtual visit: Icelynn, Onken LPN.   I discussed the limitations, risks, security and privacy concerns of performing an evaluation and management service by telephone and the availability of in person appointments. I also discussed with the patient that there may be a patient responsible charge related to this service. The patient expressed understanding and verbally consented to this telephonic visit.    Interactive audio and video telecommunications were attempted between this provider and patient, however failed, due to patient having technical difficulties OR patient did not have access to video capability.  We continued and completed visit with audio only.     Vital signs may be patient reported or missing.  Subjective:   Kim Holloway is a 76 y.o. female who presents for Medicare Annual (Subsequent) preventive examination.  Review of Systems     Cardiac Risk Factors include: advanced age (>73mn, >>80women);diabetes mellitus;hypertension;dyslipidemia     Objective:    Today's Vitals   02/22/22 1456  Weight: 165 lb (74.8 kg)  Height: '5\' 5"'$  (1.651 m)   Body mass index is 27.46 kg/m.     02/22/2022    2:59 PM 07/26/2019    9:21 AM 06/26/2018   10:50 AM 07/31/2017    6:06 AM 07/21/2017    1:29 PM 06/23/2017    1:16 PM  Advanced Directives  Does Patient Have a Medical Advance Directive? No No No No No Yes  Type of ATeacher, early years/preLiving will  Does patient want to make changes to medical advance directive?      Yes (MAU/Ambulatory/Procedural Areas - Information given)  Copy of HHartshornein Chart?      No - copy requested  Would patient like information on creating a medical advance directive?  No - Patient  declined No - Patient declined No - Patient declined      Current Medications (verified) Outpatient Encounter Medications as of 02/22/2022  Medication Sig   aspirin EC 81 MG tablet Take 81 mg by mouth daily.   Calcium Carb-Cholecalciferol (CALCIUM 600 + D PO) Take 2 tablets by mouth daily.   cholecalciferol (VITAMIN D) 1000 units tablet Take 1,000 Units by mouth daily.   irbesartan-hydrochlorothiazide (AVALIDE) 150-12.5 MG tablet TAKE 1 TABLET BY MOUTH ONCE DAILY FOR HIGH BLOOD  PRESSURE   rosuvastatin (CRESTOR) 5 MG tablet TAKE 1 TABLET BY MOUTH IN  THE EVENING FOR CHOLESTEROL   No facility-administered encounter medications on file as of 02/22/2022.    Allergies (verified) Patient has no known allergies.   History: Past Medical History:  Diagnosis Date   Abnormal breast biopsy 10/04/2013   right breast   Family history of adverse reaction to anesthesia 2016   brother had difficulty waking up after surgery   History of colonoscopy 2008   Hypertension    Mammographic calcification 06/2017   Proximal humerus fracture 03/15/2021   Left   Past Surgical History:  Procedure Laterality Date   BREAST BIOPSY Right 2014   stereo bx benign calcs   BREAST BIOPSY Left ?   cyst   CATARACT EXTRACTION  01/19/2016, 02/19/2016   COLONOSCOPY  2008   CYST EXCISION Right 07/31/2017   Procedure: CYST REMOVAL RIGHT INDEX FINGER;  Surgeon: MHessie Knows MD;  Location: [facility] ORS;  Service:  Orthopedics;  Laterality: Right;   EYE SURGERY Left 12/2016   laser surgery   Family History  Problem Relation Age of Onset   Breast cancer Neg Hx    Social History   Socioeconomic History   Marital status: Married    Spouse name: Not on file   Number of children: Not on file   Years of education: Not on file   Highest education level: Not on file  Occupational History   Not on file  Tobacco Use   Smoking status: Never   Smokeless tobacco: Never  Vaping Use   Vaping Use: Never used  Substance and  Sexual Activity   Alcohol use: No   Drug use: No   Sexual activity: Not on file  Other Topics Concern   Not on file  Social History Narrative   Married.   Moved from [place]. Originally from [place].   Retired.      Social Determinants of Health   Financial Resource Strain: Low Risk    Difficulty of Paying Living Expenses: Not hard at all  Food Insecurity: No Food Insecurity   Worried About Charity fundraiser in the Last Year: Never true   [place] in the Last Year: Never true  Transportation Needs: No Transportation Needs   Lack of Transportation (Medical): No   Lack of Transportation (Non-Medical): No  Physical Activity: Sufficiently Active   Days of Exercise per Week: 7 days   Minutes of Exercise per Session: 50 min  Stress: No Stress Concern Present   Feeling of Stress : Not at all  Social Connections: Not on file    Tobacco Counseling Counseling given: Not Answered   Clinical Intake:  Pre-visit preparation completed: Yes  Pain : No/denies pain     Nutritional Status: BMI 25 -29 Overweight Nutritional Risks: None Diabetes: Yes  How often do you need to have someone help you when you read instructions, pamphlets, or other written materials from your doctor or pharmacy?: 1 - Never What is the last grade level you completed in school?: 10th grade, GED  Diabetic? Yes Nutrition Risk Assessment:  Has the patient had any N/V/D within the last 2 months?  No  Does the patient have any non-healing wounds?  No  Has the patient had any unintentional weight loss or weight gain?  No   Diabetes:  Is the patient diabetic?  Yes  If diabetic, was a CBG obtained today?  No  Did the patient bring in their glucometer from home?  No  How often do you monitor your CBG's? Does not.   Financial Strains and Diabetes Management:  Are you having any financial strains with the device, your supplies or your medication? No .  Does the patient want to be seen by Chronic Care  Management for management of their diabetes?  No  Would the patient like to be referred to a Nutritionist or for Diabetic Management?  No   Diabetic Exams:  Diabetic Eye Exam: Completed 03/13/2021 Diabetic Foot Exam: Completed 11/01/2021   Interpreter Needed?: No  Information entered by :: NAllen LPN   Activities of Daily Living    02/22/2022    3:01 PM 02/21/2022    3:16 PM  In your present state of health, do you have any difficulty performing the following activities:  Hearing? 0 0  Vision? 0 0  Difficulty concentrating or making decisions? 0 0  Walking or climbing stairs? 0 0  Dressing or bathing? 0 0  Doing errands, shopping? 0 0  Preparing Food and eating ? N N  Using the Toilet? N N  In the past six months, have you accidently leaked urine? N N  Do you have problems with loss of bowel control? N N  Managing your Medications? N N  Managing your Finances? N N  Housekeeping or managing your Housekeeping? N N    Patient Care Team: Pleas Koch, NP as PCP - General (Internal Medicine) [place], [place] [facility]) Renee Harder, MD as Consulting Physician (Orthopedic Surgery)  Indicate any recent Medical Services you may have received from other than [facility] providers in the past year (date may be approximate).     Assessment:   This is a routine wellness examination for Kim Holloway.  Hearing/Vision screen Vision Screening - Comments:: Regular eye exams, [facility]  Dietary issues and exercise activities discussed: Current Exercise Habits: Home exercise routine, Type of exercise: walking, Time (Minutes): 50, Frequency (Times/Week): 7, Weekly Exercise (Minutes/Week): 350   Goals Addressed             This Visit's Progress    Patient Stated       02/22/2022, wants to lose some weight       Depression Screen    02/22/2022    3:00 PM 05/01/2021    9:07 AM 07/31/2020    8:45 AM 07/31/2020    8:29 AM 07/26/2019    9:22 AM 06/28/2018   11:26  AM 06/23/2017    1:15 PM  PHQ 2/9 Scores  PHQ - 2 Score 0 0 0 0 0 0 0  PHQ- 9 Score  1   0 0 1    Fall Risk    02/22/2022    3:00 PM 02/21/2022    3:16 PM 05/01/2021    9:07 AM 07/31/2020    8:45 AM 07/31/2020    8:29 AM  Fall Risk   Falls in the past year? '1  1 1 1  '$ Comment fell in the bathroom      Number falls in past yr: 1 0 0 0 0  Injury with Fall? '1 1 1 '$ 0 0  Risk for fall due to : Medication side effect      Follow up Falls evaluation completed;Education provided;Falls prevention discussed  Falls evaluation completed  Falls evaluation completed    FALL RISK PREVENTION PERTAINING TO THE HOME:  Any stairs in or around the home? No  If so, are there any without handrails?  N/a Home free of loose throw rugs in walkways, pet beds, electrical cords, etc? Yes  Adequate lighting in your home to reduce risk of falls? Yes   ASSISTIVE DEVICES UTILIZED TO PREVENT FALLS:  Life alert? No  Use of a cane, walker or w/c? No  Grab bars in the bathroom? Yes  Shower chair or bench in shower? Yes  Elevated toilet seat or a handicapped toilet? Yes   TIMED UP AND GO:  Was the test performed? No .      Cognitive Function:    07/26/2019    9:29 AM 06/28/2018   11:26 AM 06/23/2017    1:35 PM  MMSE - Mini Mental State Exam  Orientation to time '5 5 5  '$ Orientation to Place '5 5 5  '$ Registration '3 3 3  '$ Attention/ Calculation 5 0 0  Recall '3 3 3  '$ Language- name 2 objects  0 0  Language- repeat '1 1 1  '$ Language- follow 3 step command  3  3  Language- read & follow direction  0 0  Write a sentence  0 0  Copy design  0 0  Total score  20 20        02/22/2022    3:01 PM  6CIT Screen  What Year? 0 points  What month? 0 points  What time? 0 points  Count back from 20 0 points  Months in reverse 0 points  Repeat phrase 0 points  Total Score 0 points    Immunizations Immunization History  Administered Date(s) Administered   PFIZER(Purple Top)SARS-COV-2 Vaccination 10/27/2019,  11/17/2019   Pneumococcal Conjugate-13 11/30/2014   Pneumococcal Polysaccharide-23 07/07/2013   Tdap 07/07/2013   Zoster, Live 01/01/2016    TDAP status: Up to date  Flu Vaccine status: Declined, Education has been provided regarding the importance of this vaccine but patient still declined. Advised may receive this vaccine at local pharmacy or Health Dept. Aware to provide a copy of the vaccination record if obtained from local pharmacy or Health Dept. Verbalized acceptance and understanding.  Pneumococcal vaccine status: Up to date  Covid-19 vaccine status: Completed vaccines  Qualifies for Shingles Vaccine? Yes   Zostavax completed Yes   Shingrix Completed?: No.    Education has been provided regarding the importance of this vaccine. Patient has been advised to call insurance company to determine out of pocket expense if they have not yet received this vaccine. Advised may also receive vaccine at local pharmacy or Health Dept. Verbalized acceptance and understanding.  Screening Tests Health Maintenance  Topic Date Due   INFLUENZA VACCINE  07/06/2049 (Originally 05/07/2022)   OPHTHALMOLOGY EXAM  03/13/2022   HEMOGLOBIN A1C  05/01/2022   FOOT EXAM  11/01/2022   Fecal DNA (Cologuard)  12/07/2022   TETANUS/TDAP  07/08/2023   Pneumonia Vaccine 79+ Years old  Completed   DEXA SCAN  Completed   Hepatitis C Screening  Completed   HPV VACCINES  Aged Out   COVID-19 Vaccine  Discontinued   Zoster Vaccines- Shingrix  Discontinued    Health Maintenance  There are no preventive care reminders to display for this patient.  Colorectal cancer screening: Type of screening: Cologuard. Completed 12/07/2019. Repeat every 3 years  Mammogram status: Completed 06/12/2021. Repeat every year  Bone Density status: Completed 08/16/2019.   Lung Cancer Screening: (Low Dose CT Chest recommended if Age 80-80 years, 30 pack-year currently smoking OR have quit w/in 15years.) does not qualify.   Lung Cancer  Screening Referral: no  Additional Screening:  Hepatitis C Screening: does qualify; Completed 06/23/2017  Vision Screening: Recommended annual ophthalmology exams for early detection of glaucoma and other disorders of the eye. Is the patient up to date with their annual eye exam?  Yes  Who is the provider or what is the name of the office in which the patient attends annual eye exams? [facility] If pt is not established with a provider, would they like to be referred to a provider to establish care? No .   Dental Screening: Recommended annual dental exams for proper oral hygiene  Community Resource Referral / Chronic Care Management: CRR required this visit?  No   CCM required this visit?  No      Plan:     I have personally reviewed and noted the following in the patient's chart:   Medical and social history Use of alcohol, tobacco or illicit drugs  Current medications and supplements including opioid prescriptions.  Functional ability and status Nutritional status Physical activity  Advanced directives List of other physicians Hospitalizations, surgeries, and ER visits in previous 12 months Vitals Screenings to include cognitive, depression, and falls Referrals and appointments  In addition, I have reviewed and discussed with patient certain preventive protocols, quality metrics, and best practice recommendations. A written personalized care plan for preventive services as well as general preventive health recommendations were provided to patient.     Kellie Simmering, LPN   10/16/3157   Nurse Notes: none  Due to this being a virtual visit, the after visit summary with patients personalized plan was offered to patient via mail or my-chart. Patient would like to access on my-chart

## 2022-02-22 NOTE — Patient Instructions (Signed)
Kim Holloway , Thank you for taking time to come for your Medicare Wellness Visit. I appreciate your ongoing commitment to your health goals. Please review the following plan we discussed and let me know if I can assist you in the future.   Screening recommendations/referrals: Colonoscopy: cologuard 12/07/2019, due 12/07/2022 Mammogram: completed 06/12/2021, due 06/13/2022 Bone Density: completed 08/16/2019 Recommended yearly ophthalmology/optometry visit for glaucoma screening and checkup Recommended yearly dental visit for hygiene and checkup  Vaccinations: Influenza vaccine: decline Pneumococcal vaccine: completed 11/30/2014 Tdap vaccine: completed 07/07/2013, due 07/08/2023 Shingles vaccine: discussed   Covid-19: 11/17/2019, 10/27/2019  Advanced directives: Advance directive discussed with you today.   Conditions/risks identified: none  Next appointment: Follow up in one year for your annual wellness visit    Preventive Care 76 Years and Older, Female Preventive care refers to lifestyle choices and visits with your health care provider that can promote health and wellness. What does preventive care include? A yearly physical exam. This is also called an annual well check. Dental exams once or twice a year. Routine eye exams. Ask your health care provider how often you should have your eyes checked. Personal lifestyle choices, including: Daily care of your teeth and gums. Regular physical activity. Eating a healthy diet. Avoiding tobacco and drug use. Limiting alcohol use. Practicing safe sex. Taking low-dose aspirin every day. Taking vitamin and mineral supplements as recommended by your health care provider. What happens during an annual well check? The services and screenings done by your health care provider during your annual well check will depend on your age, overall health, lifestyle risk factors, and family history of disease. Counseling  Your health care provider may ask you  questions about your: Alcohol use. Tobacco use. Drug use. Emotional well-being. Home and relationship well-being. Sexual activity. Eating habits. History of falls. Memory and ability to understand (cognition). Work and work Statistician. Reproductive health. Screening  You may have the following tests or measurements: Height, weight, and BMI. Blood pressure. Lipid and cholesterol levels. These may be checked every 5 years, or more frequently if you are over 50 years old. Skin check. Lung cancer screening. You may have this screening every year starting at age 67 if you have a 30-pack-year history of smoking and currently smoke or have quit within the past 15 years. Fecal occult blood test (FOBT) of the stool. You may have this test every year starting at age 65. Flexible sigmoidoscopy or colonoscopy. You may have a sigmoidoscopy every 5 years or a colonoscopy every 10 years starting at age 56. Hepatitis C blood test. Hepatitis B blood test. Sexually transmitted disease (STD) testing. Diabetes screening. This is done by checking your blood sugar (glucose) after you have not eaten for a while (fasting). You may have this done every 1-3 years. Bone density scan. This is done to screen for osteoporosis. You may have this done starting at age 11. Mammogram. This may be done every 1-2 years. Talk to your health care provider about how often you should have regular mammograms. Talk with your health care provider about your test results, treatment options, and if necessary, the need for more tests. Vaccines  Your health care provider may recommend certain vaccines, such as: Influenza vaccine. This is recommended every year. Tetanus, diphtheria, and acellular pertussis (Tdap, Td) vaccine. You may need a Td booster every 10 years. Zoster vaccine. You may need this after age 11. Pneumococcal 13-valent conjugate (PCV13) vaccine. One dose is recommended after age 46. Pneumococcal polysaccharide  (PPSV23)  vaccine. One dose is recommended after age 25. Talk to your health care provider about which screenings and vaccines you need and how often you need them. This information is not intended to replace advice given to you by your health care provider. Make sure you discuss any questions you have with your health care provider. Document Released: 10/20/2015 Document Revised: 06/12/2016 Document Reviewed: 07/25/2015 Elsevier Interactive Patient Education  2017 [employer] Prevention in the Home Falls can cause injuries. They can happen to people of all ages. There are many things you can do to make your home safe and to help prevent falls. What can I do on the outside of my home? Regularly fix the edges of walkways and driveways and fix any cracks. Remove anything that might make you trip as you walk through a door, such as a raised step or threshold. Trim any bushes or trees on the path to your home. Use bright outdoor lighting. Clear any walking paths of anything that might make someone trip, such as rocks or tools. Regularly check to see if handrails are loose or broken. Make sure that both sides of any steps have handrails. Any raised decks and porches should have guardrails on the edges. Have any leaves, snow, or ice cleared regularly. Use sand or salt on walking paths during winter. Clean up any spills in your garage right away. This includes oil or grease spills. What can I do in the bathroom? Use night lights. Install grab bars by the toilet and in the tub and shower. Do not use towel bars as grab bars. Use non-skid mats or decals in the tub or shower. If you need to sit down in the shower, use a plastic, non-slip stool. Keep the floor dry. Clean up any water that spills on the floor as soon as it happens. Remove soap buildup in the tub or shower regularly. Attach bath mats securely with double-sided non-slip rug tape. Do not have throw rugs and other things on the  floor that can make you trip. What can I do in the bedroom? Use night lights. Make sure that you have a light by your bed that is easy to reach. Do not use any sheets or blankets that are too big for your bed. They should not hang down onto the floor. Have a firm chair that has side arms. You can use this for support while you get dressed. Do not have throw rugs and other things on the floor that can make you trip. What can I do in the kitchen? Clean up any spills right away. Avoid walking on wet floors. Keep items that you use a lot in easy-to-reach places. If you need to reach something above you, use a strong step stool that has a grab bar. Keep electrical cords out of the way. Do not use floor polish or wax that makes floors slippery. If you must use wax, use non-skid floor wax. Do not have throw rugs and other things on the floor that can make you trip. What can I do with my stairs? Do not leave any items on the stairs. Make sure that there are handrails on both sides of the stairs and use them. Fix handrails that are broken or loose. Make sure that handrails are as long as the stairways. Check any carpeting to make sure that it is firmly attached to the stairs. Fix any carpet that is loose or worn. Avoid having throw rugs at the top or bottom of  the stairs. If you do have throw rugs, attach them to the floor with carpet tape. Make sure that you have a light switch at the top of the stairs and the bottom of the stairs. If you do not have them, ask someone to add them for you. What else can I do to help prevent falls? Wear shoes that: Do not have high heels. Have rubber bottoms. Are comfortable and fit you well. Are closed at the toe. Do not wear sandals. If you use a stepladder: Make sure that it is fully opened. Do not climb a closed stepladder. Make sure that both sides of the stepladder are locked into place. Ask someone to hold it for you, if possible. Clearly mark and make  sure that you can see: Any grab bars or handrails. First and last steps. Where the edge of each step is. Use tools that help you move around (mobility aids) if they are needed. These include: Canes. Walkers. Scooters. Crutches. Turn on the lights when you go into a dark area. Replace any light bulbs as soon as they burn out. Set up your furniture so you have a clear path. Avoid moving your furniture around. If any of your floors are uneven, fix them. If there are any pets around you, be aware of where they are. Review your medicines with your doctor. Some medicines can make you feel dizzy. This can increase your chance of falling. Ask your doctor what other things that you can do to help prevent falls. This information is not intended to replace advice given to you by your health care provider. Make sure you discuss any questions you have with your health care provider. Document Released: 07/20/2009 Document Revised: 02/29/2016 Document Reviewed: 10/28/2014 Elsevier Interactive Patient Education  2017 [employer].

## 2022-03-15 DIAGNOSIS — H40003 Preglaucoma, unspecified, bilateral: Secondary | ICD-10-CM | POA: Diagnosis not present

## 2022-03-15 LAB — HM DIABETES EYE EXAM

## 2022-03-21 DIAGNOSIS — E119 Type 2 diabetes mellitus without complications: Secondary | ICD-10-CM | POA: Diagnosis not present

## 2022-03-21 DIAGNOSIS — H40003 Preglaucoma, unspecified, bilateral: Secondary | ICD-10-CM | POA: Diagnosis not present

## 2022-03-21 DIAGNOSIS — H35371 Puckering of macula, right eye: Secondary | ICD-10-CM | POA: Diagnosis not present

## 2022-03-21 LAB — HM DIABETES EYE EXAM

## 2022-04-20 ENCOUNTER — Other Ambulatory Visit: Payer: Self-pay | Admitting: Primary Care

## 2022-04-20 DIAGNOSIS — I10 Essential (primary) hypertension: Secondary | ICD-10-CM

## 2022-04-29 ENCOUNTER — Other Ambulatory Visit: Payer: Self-pay | Admitting: Primary Care

## 2022-04-29 ENCOUNTER — Other Ambulatory Visit: Payer: Self-pay | Admitting: Internal Medicine

## 2022-04-29 DIAGNOSIS — Z1231 Encounter for screening mammogram for malignant neoplasm of breast: Secondary | ICD-10-CM

## 2022-05-01 ENCOUNTER — Ambulatory Visit (INDEPENDENT_AMBULATORY_CARE_PROVIDER_SITE_OTHER): Payer: Medicare Other | Admitting: Primary Care

## 2022-05-01 ENCOUNTER — Encounter: Payer: Self-pay | Admitting: Primary Care

## 2022-05-01 VITALS — BP 124/76 | HR 78 | Temp 98.6°F | Ht 65.0 in | Wt 157.0 lb

## 2022-05-01 DIAGNOSIS — I10 Essential (primary) hypertension: Secondary | ICD-10-CM | POA: Diagnosis not present

## 2022-05-01 DIAGNOSIS — H409 Unspecified glaucoma: Secondary | ICD-10-CM | POA: Diagnosis not present

## 2022-05-01 DIAGNOSIS — L729 Follicular cyst of the skin and subcutaneous tissue, unspecified: Secondary | ICD-10-CM

## 2022-05-01 DIAGNOSIS — E1169 Type 2 diabetes mellitus with other specified complication: Secondary | ICD-10-CM

## 2022-05-01 DIAGNOSIS — E785 Hyperlipidemia, unspecified: Secondary | ICD-10-CM

## 2022-05-01 DIAGNOSIS — E1165 Type 2 diabetes mellitus with hyperglycemia: Secondary | ICD-10-CM | POA: Diagnosis not present

## 2022-05-01 DIAGNOSIS — E2839 Other primary ovarian failure: Secondary | ICD-10-CM | POA: Diagnosis not present

## 2022-05-01 HISTORY — DX: Follicular cyst of the skin and subcutaneous tissue, unspecified: L72.9

## 2022-05-01 LAB — COMPREHENSIVE METABOLIC PANEL
ALT: 14 U/L (ref 0–35)
AST: 16 U/L (ref 0–37)
Albumin: 4.4 g/dL (ref 3.5–5.2)
Alkaline Phosphatase: 84 U/L (ref 39–117)
BUN: 14 mg/dL (ref 6–23)
CO2: 31 mEq/L (ref 19–32)
Calcium: 9.2 mg/dL (ref 8.4–10.5)
Chloride: 103 mEq/L (ref 96–112)
Creatinine, Ser: 0.69 mg/dL (ref 0.40–1.20)
GFR: 84.63 mL/min (ref >60.00)
Glucose, Bld: 119 mg/dL — ABNORMAL HIGH (ref 70–99)
Potassium: 3.7 mEq/L (ref 3.5–5.1)
Sodium: 140 mEq/L (ref 135–145)
Total Bilirubin: 0.6 mg/dL (ref 0.2–1.2)
Total Protein: 7.4 g/dL (ref 6.0–8.3)

## 2022-05-01 LAB — LIPID PANEL
Cholesterol: 133 mg/dL (ref 0–200)
HDL: 56.8 mg/dL (ref >39.00)
LDL Cholesterol: 61 mg/dL (ref 0–99)
NonHDL: 76.4
Total CHOL/HDL Ratio: 2
Triglycerides: 77 mg/dL (ref 0.0–149.0)
VLDL: 15.4 mg/dL (ref 0.0–40.0)

## 2022-05-01 LAB — CBC
HCT: 40.2 % (ref 36.0–46.0)
Hemoglobin: 13.6 g/dL (ref 12.0–15.0)
MCHC: 33.9 g/dL (ref 30.0–36.0)
MCV: 93.7 fl (ref 78.0–100.0)
Platelets: 250 10*3/uL (ref 150.0–400.0)
RBC: 4.28 Mil/uL (ref 3.87–5.11)
RDW: 12.9 % (ref 11.5–15.5)
WBC: 8.2 10*3/uL (ref 4.0–10.5)

## 2022-05-01 LAB — HEMOGLOBIN A1C: Hgb A1c MFr Bld: 6.2 % (ref 4.6–6.5)

## 2022-05-01 NOTE — Assessment & Plan Note (Signed)
Repeat A1C pending. Continue off medications.  Managed on statin. Managed on ARB.   Follow up in 6 months.

## 2022-05-01 NOTE — Assessment & Plan Note (Signed)
Following with ophthalmology. 

## 2022-05-01 NOTE — Assessment & Plan Note (Signed)
Continue rosuvastatin 5 mg daily. Repeat lipid panel pending 

## 2022-05-01 NOTE — Progress Notes (Signed)
Subjective:    Patient ID: Kim Holloway, female    DOB: 16-Aug-1946, 76 y.o.   MRN: [id number]  Diabetes Pertinent negatives for hypoglycemia include no dizziness or headaches. Pertinent negatives for diabetes include no chest pain.    Kim Holloway is a very pleasant 76 y.o. female with a history of type 2 diabetes, hyperlipidemia, hypertension, glaucoma who presents today for follow-up of chronic conditions. She is also due for her bone density scan.   1) Type 2 Diabetes:   Current medications include: None.  She is checking her blood glucose 0 times daily.  She denies numbness to her feet.   Last A1C: 6.6 in January 2023 Last Eye Exam: Due Last Foot Exam: UTD Pneumonia Vaccination: 2016 Urine Microalbumin: None. Managed on ARB Statin: rosuvastatin   Dietary changes since last visit: She has cut out candy and most sweets. She has increased vegetables.    Exercise: She is walking 3 miles daily.   2) Hypertension: Currently managed on irbesartan-hydrochlorothiazide 150-12.5 mg daily. She denies chest pain, headaches, dizziness.   BP Readings from Last 3 Encounters:  05/01/22 124/76  11/01/21 124/78  05/01/21 126/78   3) Hyperlipidemia: Currently managed on rosuvastatin 5 mg daily. She is due for repeat lipid panel today.   4) Cysts: Chronic and intermittent to the skin for years. She has three cysts to her scalp. She would like evaluation for removal of these cysts. She has had several cysts removed by a surgeon in the past.   Review of Systems  Eyes:  Negative for visual disturbance.  Respiratory:  Negative for shortness of breath.   Cardiovascular:  Negative for chest pain.  Gastrointestinal:  Negative for constipation and diarrhea.  Neurological:  Negative for dizziness, numbness and headaches.         Past Medical History:  Diagnosis Date   Abnormal breast biopsy 10/04/2013   right breast   Family history of adverse reaction to anesthesia 2016   brother  had difficulty waking up after surgery   History of colonoscopy 2008   Hypertension    Mammographic calcification 06/2017   Proximal humerus fracture 03/15/2021   Left    Social History   Socioeconomic History   Marital status: Married    Spouse name: Not on file   Number of children: Not on file   Years of education: Not on file   Highest education level: Not on file  Occupational History   Not on file  Tobacco Use   Smoking status: Never   Smokeless tobacco: Never  Vaping Use   Vaping Use: Never used  Substance and Sexual Activity   Alcohol use: No   Drug use: No   Sexual activity: Not on file  Other Topics Concern   Not on file  Social History Narrative   Married.   Moved from [place]. Originally from [place].   Retired.      Social Determinants of Health   Financial Resource Strain: Low Risk  (02/22/2022)   Overall Financial Resource Strain (CARDIA)    Difficulty of Paying Living Expenses: Not hard at all  Food Insecurity: No Food Insecurity (02/22/2022)   Hunger Vital Sign    Worried About Running Out of Food in the Last Year: Never true    Ran Out of Food in the Last Year: Never true  Transportation Needs: No Transportation Needs (02/22/2022)   PRAPARE - Hydrologist (Medical): No    Lack of Transportation (Non-Medical): No  Physical Activity: Sufficiently Active (02/22/2022)   Exercise Vital Sign    Days of Exercise per Week: 7 days    Minutes of Exercise per Session: 50 min  Stress: No Stress Concern Present (02/22/2022)   [place]    Feeling of Stress : Not at all  Social Connections: Not on file  Intimate Partner Violence: Not At Risk (07/26/2019)   Humiliation, Afraid, Rape, and Kick questionnaire    Fear of Current or Ex-Partner: No    Emotionally Abused: No    Physically Abused: No    Sexually Abused: No    Past Surgical History:  Procedure Laterality  Date   BREAST BIOPSY Right 2014   stereo bx benign calcs   BREAST BIOPSY Left ?   cyst   CATARACT EXTRACTION  01/19/2016, 02/19/2016   COLONOSCOPY  2008   CYST EXCISION Right 07/31/2017   Procedure: CYST REMOVAL RIGHT INDEX FINGER;  Surgeon: Hessie Knows, MD;  Location: [facility] ORS;  Service: Orthopedics;  Laterality: Right;   EYE SURGERY Left 12/2016   laser surgery    Family History  Problem Relation Age of Onset   Breast cancer Neg Hx     No Known Allergies  Current Outpatient Medications on File Prior to Visit  Medication Sig Dispense Refill   aspirin EC 81 MG tablet Take 81 mg by mouth daily.     Calcium Carb-Cholecalciferol (CALCIUM 600 + D PO) Take 2 tablets by mouth daily.     cholecalciferol (VITAMIN D) 1000 units tablet Take 1,000 Units by mouth daily.     irbesartan-hydrochlorothiazide (AVALIDE) 150-12.5 MG tablet TAKE 1 TABLET BY MOUTH ONCE  DAILY FOR HIGH BLOOD PRESSURE 90 tablet 0   rosuvastatin (CRESTOR) 5 MG tablet TAKE 1 TABLET BY MOUTH IN  THE EVENING FOR CHOLESTEROL 90 tablet 1   No current facility-administered medications on file prior to visit.    BP 124/76   Pulse 78   Temp 98.6 F (37 C) (Oral)   Ht '5\' 5"'$  (1.651 m)   Wt 157 lb (71.2 kg)   SpO2 94%   BMI 26.13 kg/m  Objective:   Physical Exam Cardiovascular:     Rate and Rhythm: Normal rate and regular rhythm.  Pulmonary:     Effort: Pulmonary effort is normal.     Breath sounds: Normal breath sounds.  Musculoskeletal:     Cervical back: Neck supple.  Skin:    General: Skin is warm and dry.     Comments: Two masses palpated to scalp:  1 cm rounded, raised, firm, immobile, non tender mass to right lower parietal lobe.  0.25 cm rounded, raised, firm, immobile, non tender mass to right occipital lobe.  Neurological:     Mental Status: She is alert.  Psychiatric:        Mood and Affect: Mood normal.           Assessment & Plan:   Problem List Items Addressed This Visit        Cardiovascular and Mediastinum   Essential hypertension - Primary    Controlled.  Continue irbesartan-HCTZ 150-12.5 mg daily. CMP pending.       Relevant Orders   Comprehensive metabolic panel   CBC     Endocrine   Type 2 diabetes mellitus with hyperglycemia (HCC)    Repeat A1C pending. Continue off medications.  Managed on statin. Managed on ARB.   Follow up in 6 months.  Relevant Orders   Hemoglobin A1c   Hyperlipidemia associated with type 2 diabetes mellitus (HCC)    Continue rosuvastatin 5 mg daily. Repeat lipid panel pending.      Relevant Orders   Lipid panel   Comprehensive metabolic panel     Musculoskeletal and Integument   Scalp cyst    Noted to scalp. Referral placed for general surgery per patient request       Relevant Orders   Ambulatory referral to General Surgery     Other   Glaucoma of left eye    Following with ophthalmology.       Other Visit Diagnoses     Estrogen deficiency       Relevant Orders   DG Bone Density          Pleas Koch, NP

## 2022-05-01 NOTE — Assessment & Plan Note (Signed)
Noted to scalp. Referral placed for general surgery per patient request

## 2022-05-01 NOTE — Patient Instructions (Signed)
Stop by the lab prior to leaving today. I will notify you of your results once received.   You will be contacted regarding your referral to general surgery.  Please let us know if you have not been contacted within two weeks.   Please schedule a follow up visit for 6 months for a diabetes check.  It was a pleasure to see you today!

## 2022-05-01 NOTE — Assessment & Plan Note (Addendum)
Controlled.  Continue irbesartan-HCTZ 150-12.5 mg daily. CMP pending.

## 2022-05-12 ENCOUNTER — Other Ambulatory Visit: Payer: Self-pay | Admitting: Primary Care

## 2022-05-12 DIAGNOSIS — E785 Hyperlipidemia, unspecified: Secondary | ICD-10-CM

## 2022-05-15 ENCOUNTER — Ambulatory Visit (INDEPENDENT_AMBULATORY_CARE_PROVIDER_SITE_OTHER): Payer: Medicare Other | Admitting: Surgery

## 2022-05-15 ENCOUNTER — Encounter: Payer: Self-pay | Admitting: Surgery

## 2022-05-15 VITALS — BP 166/91 | HR 82 | Temp 98.0°F | Ht 65.0 in | Wt 157.2 lb

## 2022-05-15 DIAGNOSIS — L729 Follicular cyst of the skin and subcutaneous tissue, unspecified: Secondary | ICD-10-CM | POA: Diagnosis not present

## 2022-05-15 NOTE — Progress Notes (Signed)
05/15/2022  Reason for Visit: Scalp cysts  Requesting Provider: Alma Friendly, NP  History of Present Illness: Kim Holloway is a 76 y.o. female presenting for evaluation of 3 scalp cysts.  The patient reports that she has had this for many years and overall have not been growing in size or giving her any discomfort.  The main complaint from dose is that when she combs her hair, the brush hits the cyst and is a little bit tender and irritates.  The patient denies any episodes where the cysts have been growing in size or become more swollen or red or tender and denies any drainage.  She reports that she has had previous cysts in the past which had been resected where she used to live in [place].  Patient denies other cysts and denies any other complaints at this point..  Past Medical History: Past Medical History:  Diagnosis Date   Abnormal breast biopsy 10/04/2013   right breast   Family history of adverse reaction to anesthesia 2016   brother had difficulty waking up after surgery   History of colonoscopy 2008   Hypertension    Mammographic calcification 06/2017   Proximal humerus fracture 03/15/2021   Left     Past Surgical History: Past Surgical History:  Procedure Laterality Date   BREAST BIOPSY Right 2014   stereo bx benign calcs   BREAST BIOPSY Left ?   cyst   CATARACT EXTRACTION  01/19/2016, 02/19/2016   COLONOSCOPY  2008   CYST EXCISION Right 07/31/2017   Procedure: CYST REMOVAL RIGHT INDEX FINGER;  Surgeon: Hessie Knows, MD;  Location: [facility] ORS;  Service: Orthopedics;  Laterality: Right;   EYE SURGERY Left 12/2016   laser surgery    Home Medications: Prior to Admission medications   Medication Sig Start Date End Date Taking? Authorizing Provider  aspirin EC 81 MG tablet Take 81 mg by mouth daily.   Yes [provider]  Calcium Carb-Cholecalciferol (CALCIUM 600 + D PO) Take 2 tablets by mouth daily.   Yes [provider]  cholecalciferol  (VITAMIN D) 1000 units tablet Take 1,000 Units by mouth daily.   Yes [provider]  irbesartan-hydrochlorothiazide (AVALIDE) 150-12.5 MG tablet TAKE 1 TABLET BY MOUTH ONCE  DAILY FOR HIGH BLOOD PRESSURE 04/21/22  Yes Pleas Koch, NP  rosuvastatin (CRESTOR) 5 MG tablet TAKE 1 TABLET BY MOUTH IN THE  EVENING FOR CHOLESTEROL 05/13/22  Yes Pleas Koch, NP    Allergies: No Known Allergies  Social History:  reports that she has never smoked. She has never used smokeless tobacco. She reports that she does not drink alcohol and does not use drugs.   Family History: Family History  Problem Relation Age of Onset   Breast cancer Neg Hx     Review of Systems: Review of Systems  Constitutional:  Negative for chills and fever.  Respiratory:  Negative for shortness of breath.   Cardiovascular:  Negative for chest pain.  Gastrointestinal:  Negative for abdominal pain, nausea and vomiting.  Skin:        Scalp cysts    Physical Exam BP (!) 166/91   Pulse 82   Temp 98 F (36.7 C) (Oral)   Ht '5\' 5"'$  (1.651 m)   Wt 157 lb 3.2 oz (71.3 kg)   SpO2 93%   BMI 26.16 kg/m  CONSTITUTIONAL: No acute distress, well-nourished HEENT: The patient has 3 cysts on her scalp between the crown and posterior aspect.  The largest cysts  which is the one at the crown measures about 1.5 cm and is soft, nontender, without any overlying erythema or drainage.  The other 2 cysts are smaller measuring about 1 cm each and are located more in the posterior aspect of the scalp.  These are also nontender without any erythema or drainage. RESPIRATORY:  Normal respiratory effort without pathologic use of accessory muscles. CARDIOVASCULAR: Regular rhythm and rate MUSCULOSKELETAL:  Normal muscle strength and tone in all four extremities.  No peripheral edema or cyanosis. NEUROLOGIC:  Motor and sensation is grossly normal.  Cranial nerves are grossly intact. PSYCH:  Alert and oriented to person, place and  time. Affect is normal.  Laboratory Analysis: Labs from 05/01/2022: Sodium 140, potassium 3.7,Chloride 103, CO2 31,BUN 14, creatinine 0.69, total bilirubin 0.6, AST 16, ALT 14, alkaline phosphatase 84. WBC 8.2, hemoglobin 13.6, hematocrit 40.2, platelets 250.  Imaging: No results found.  Assessment and Plan: This is a 76 y.o. female with 3 scalp cysts.  - Discussed with patient that this is her small that we can remove them in the office as office procedures without needing general anesthesia.  The patient is in agreement would rather do this in the office without having to go to the operating room.  Given that these are 3 cysts, discussed with her the need for splitting them into 2 different procedure visits.  Would be able to tackle the larger cyst and 1 procedure and the other 2 and the other procedure.  Patient is in agreement with this.  Reviewed with her the procedure at length including the risks of bleeding, infection, injury to surrounding structures, we will have to shave some of the hair around each cyst in order to clear the area but that the hair will grow back after.  Also discussed with her that she will need to stop her baby aspirin for a few days prior to his procedure. - Will schedule patient for her first procedure on 05/29/2022.  All of her questions have been answered.  I spent 30 minutes dedicated to the care of this patient on the date of this encounter to include pre-visit review of records, face-to-face time with the patient discussing diagnosis and management, and any post-visit coordination of care.   Melvyn Neth, [facility]

## 2022-05-15 NOTE — Patient Instructions (Signed)
Please STOP Aspirin 5 days prior to procedure.    If you have any concerns or questions, please feel free to call our office. See follow up appointment below.   Epidermoid Cyst   An epidermoid cyst, also called an epidermal cyst, is a small lump under your skin. The cyst contains a substance called keratin. Do not try to pop or open the cyst yourself. What are the causes? A blocked hair follicle. A hair that curls and re-enters the skin instead of growing straight out of the skin. A blocked pore. Irritated skin. An injury to the skin. Certain conditions that are passed along from parent to child. Human papillomavirus (HPV). This happens rarely when cysts occur on the bottom of the feet. Long-term sun damage to the skin. What increases the risk? Having acne. Being female. Having an injury to the skin. Being past puberty. Having certain conditions caused by genes (genetic disorder) What are the signs or symptoms? These cysts are usually harmless, but they can get infected. Symptoms of infection may include: Redness. Inflammation. Tenderness. Warmth. Fever. A bad-smelling substance that drains from the cyst. Pus that drains from the cyst. How is this treated? In many cases, epidermoid cysts go away on their own without treatment. If a cyst becomes infected, treatment may include: Opening and draining the cyst, done by a doctor. After draining, you may need minor surgery to remove the rest of the cyst. Antibiotic medicine. Shots of medicines (steroids) that help to reduce inflammation. Surgery to remove the cyst. Surgery may be done if the cyst: Becomes large. Bothers you. Has a chance of turning into cancer. Do not try to open a cyst yourself. Follow these instructions at home: Medicines Take over-the-counter and prescription medicines as told by your doctor. If you were prescribed an antibiotic medicine, take it as told by your doctor. Do not stop taking it even if you start  to feel better. General instructions Keep the area around your cyst clean and dry. Wear loose, dry clothing. Avoid touching your cyst. Check your cyst every day for signs of infection. Check for: Redness, swelling, or pain. Fluid or blood. Warmth. Pus or a bad smell. Keep all follow-up visits. How is this prevented? Wear clean, dry, clothing. Avoid wearing tight clothing. Keep your skin clean and dry. Take showers or baths every day. Contact a doctor if: Your cyst has symptoms of infection. Your condition does not improve or gets worse. You have a cyst that looks different from other cysts you have had. You have a fever. Get help right away if: Redness spreads from the cyst into the area close by. Summary An epidermoid cyst is a small lump under your skin. If a cyst becomes infected, treatment may include surgery to open and drain the cyst, or to remove it. Take over-the-counter and prescription medicines only as told by your doctor. Contact a doctor if your condition is not improving or is getting worse. Keep all follow-up visits. This information is not intended to replace advice given to you by your health care provider. Make sure you discuss any questions you have with your health care provider. Document Revised: 12/29/2019 Document Reviewed: 12/29/2019 Elsevier Patient Education  [place].

## 2022-05-21 ENCOUNTER — Encounter: Payer: Self-pay | Admitting: Primary Care

## 2022-05-29 ENCOUNTER — Ambulatory Visit (INDEPENDENT_AMBULATORY_CARE_PROVIDER_SITE_OTHER): Payer: Medicare Other | Admitting: Surgery

## 2022-05-29 ENCOUNTER — Other Ambulatory Visit: Payer: Self-pay | Admitting: Surgery

## 2022-05-29 ENCOUNTER — Other Ambulatory Visit: Payer: Self-pay

## 2022-05-29 ENCOUNTER — Encounter: Payer: Self-pay | Admitting: Surgery

## 2022-05-29 VITALS — BP 174/84 | HR 81 | Temp 97.9°F | Ht 65.0 in | Wt 157.8 lb

## 2022-05-29 DIAGNOSIS — L7211 Pilar cyst: Secondary | ICD-10-CM | POA: Diagnosis not present

## 2022-05-29 DIAGNOSIS — L729 Follicular cyst of the skin and subcutaneous tissue, unspecified: Secondary | ICD-10-CM | POA: Diagnosis not present

## 2022-05-29 NOTE — Patient Instructions (Signed)
You may restart your Aspirin 05/31/22. You may take Tylenol for the discomfort or pain.   We have removed a Cyst in our office today.  You have sutures under the skin that will dissolve and also dermabond (skin glue) on top of your skin which will come off on it's own in 10-14 days.  You may shower in 48 hours, this is on 05/30/22.  Avoid Strenuous activities that will make you sweat during the next 48 hours to avoid the glue coming off prematurely. Avoid activities that will place pressure to this area of the body for 1-2 weeks to avoid re-injury to incision site.  Please see your follow-up appointment provided. We will see you back in office to make sure this area is healed and to review the final pathology. If you have any questions or concerns prior to this appointment, call our office and speak with a nurse.    Excision of Skin Cysts or Lesions Excision of a skin lesion refers to the removal of a section of skin by making small cuts (incisions) in the skin. This procedure may be done to remove a cancerous (malignant) or noncancerous (benign) growth on the skin. It is typically done to treat or prevent cancer or infection. It may also be done to improve cosmetic appearance. The procedure may be done to remove: Cancerous growths, such as basal cell carcinoma, squamous cell carcinoma, or melanoma. Noncancerous growths, such as a cyst or lipoma. Growths, such as moles or skin tags, which may be removed for cosmetic reasons.  Various excision or surgical techniques may be used depending on your condition, the location of the lesion, and your overall health. Tell a health care provider about: Any allergies you have. All medicines you are taking, including vitamins, herbs, eye drops, creams, and over-the-counter medicines. Any problems you or family members have had with anesthetic medicines. Any blood disorders you have. Any surgeries you have had. Any medical conditions you have. Whether  you are pregnant or may be pregnant. What are the risks? Generally, this is a safe procedure. However, problems may occur, including: Bleeding. Infection. Scarring. Recurrence of the cyst, lipoma, or cancer. Changes in skin sensation or appearance, such as discoloration or swelling. Reaction to the anesthetics. Allergic reaction to surgical materials or ointments. Damage to nerves, blood vessels, muscles, or other structures. Continued pain.  What happens before the procedure? Ask your health care provider about: Changing or stopping your regular medicines. This is especially important if you are taking diabetes medicines or blood thinners. Taking medicines such as aspirin and ibuprofen. These medicines can thin your blood. Do not take these medicines before your procedure if your health care provider instructs you not to. You may be asked to take certain medicines. You may be asked to stop smoking. You may have an exam or testing. Plan to have someone take you home after the procedure. Plan to have someone help you with activities during recovery. What happens during the procedure? To reduce your risk of infection: Your health care team will wash or sanitize their hands. Your skin will be washed with soap. You will be given a medicine to numb the area (local anesthetic). One of the following excision techniques will be performed. At the end of any of these procedures, antibiotic ointment will be applied as needed. Each of the following techniques may vary among health care providers and hospitals. Complete Surgical Excision The area of skin that needs to be removed will be marked with a  pen. Using a small scalpel or scissors, the surgeon will gently cut around and under the lesion until it is completely removed. The lesion will be placed in a fluid and sent to the lab for examination. If necessary, bleeding will be controlled with a device that delivers heat (electrocautery). The  edges of the wound may be stitched (sutured) together, and a bandage (dressing) will be applied. This procedure may be performed to treat a cancerous growth or a noncancerous cyst or lesion. Excision of a Cyst The surgeon will make an incision on the cyst. The entire cyst will be removed through the incision. The incision may be closed with sutures. Shave Excision During shave excision, the surgeon will use a small blade or an electrically heated loop instrument to shave off the lesion. This may be done to remove a mole or a skin tag. The wound will usually be left to heal on its own without sutures. Punch Excision During punch excision, the surgeon will use a small tool that is like a cookie cutter or a hole punch to cut a circle shape out of the skin. The outer edges of the skin will be sutured together. This may be done to remove a mole or a scar or to perform a biopsy of the lesion. Mohs Micrographic Surgery During Mohs micrographic surgery, layers of the lesion will be removed with a scalpel or a loop instrument and will be examined right away under a microscope. Layers will be removed until all of the abnormal or cancerous tissue has been removed. This procedure is minimally invasive, and it ensures the best cosmetic outcome. It involves the removal of as little normal tissue as possible. Mohs is usually done to treat skin cancer, such as basal cell carcinoma or squamous cell carcinoma, particularly on the face and ears. Depending on the size of the surgical wound, it may be sutured closed. What happens after the procedure? Return to your normal activities as told by your health care provider. Talk with your health care provider to discuss any test results, treatment options, and if necessary, the need for more tests. This information is not intended to replace advice given to you by your health care provider. Make sure you discuss any questions you have with your health care provider. Document  Released: 12/18/2009 Document Revised: 02/29/2016 Document Reviewed: 11/09/2014 Elsevier Interactive Patient Education  [employer].

## 2022-05-29 NOTE — Progress Notes (Signed)
  Procedure Date:  05/29/2022  Pre-operative Diagnosis: Scalp cyst  Post-operative Diagnosis: Scalp cyst, 1.5 cm.  Procedure:  Excision of scalp cyst  Surgeon:  Melvyn Neth, MD  Anesthesia:  5 ml 1% lidocaine  Estimated Blood Loss:  5 ml  Specimens: Scalp cyst  Complications: None  Indications for Procedure:  This is a 76 y.o. female with diagnosis of multiple scalp cysts.  She was seen for this on 05/15/22.  The largest cyst is about the crown of the scalp, measuring 1.5 cm.  She has two others that are smaller in the upper posterior and lower posterior scalp.  Today, we're removing the larger cyst at the crown.  The risks of bleeding, abscess or infection, injury to surrounding structures, and need for further procedures were all discussed with the patient and she was willing to proceed.  Description of Procedure: The patient was correctly identified at bedside.  The patient was placed in sitting position.  Appropriate time-outs were performed.  The patient's top of the scalp was prepped and draped in usual sterile fashion.  Local anesthetic was infused intradermally.  A 2 cm incision was made over the cyst, and scalpel was used to dissect down the skin and subcutaneous tissue.  Skin flaps were created sharply, and then the cyst was excised intact.  It was sent off to pathology.  The cavity was then irrigated and hemostasis was assured with manual pressure.  The wound was then closed in two layers using 3-0 Vicryl and 4-0 Monocryl.  The incision was cleaned and sealed with DermaBond.  The patient tolerated the procedure well and all sharps were appropriately disposed of at the end of the case.  - Patient may shower tomorrow but instructed to avoid shampoo until 05/31/2022. - May take Tylenol or ibuprofen for pain control. -Patient may resume her aspirin on 05/31/2022. - Patient will follow-up with me on 06/14/2022 for removal of the other 2 cysts.  She knows to stop her aspirin 5 days  prior to this.  Melvyn Neth, MD

## 2022-06-14 ENCOUNTER — Other Ambulatory Visit: Payer: Self-pay | Admitting: Surgery

## 2022-06-14 ENCOUNTER — Encounter: Payer: Self-pay | Admitting: Surgery

## 2022-06-14 ENCOUNTER — Ambulatory Visit (INDEPENDENT_AMBULATORY_CARE_PROVIDER_SITE_OTHER): Payer: Medicare Other | Admitting: Surgery

## 2022-06-14 VITALS — BP 161/88 | HR 77 | Temp 97.8°F | Wt 158.0 lb

## 2022-06-14 DIAGNOSIS — L7212 Trichodermal cyst: Secondary | ICD-10-CM | POA: Diagnosis not present

## 2022-06-14 DIAGNOSIS — L729 Follicular cyst of the skin and subcutaneous tissue, unspecified: Secondary | ICD-10-CM | POA: Diagnosis not present

## 2022-06-14 NOTE — Progress Notes (Signed)
  Procedure Date:  06/14/2022  Pre-operative Diagnosis: Scalp cyst x 2  Post-operative Diagnosis: Scalp cyst x 2  Procedure:  Excision of scalp cysts -- 1 cm and 0.5 cm.  Surgeon:  Melvyn Neth, MD  Anesthesia:  5 ml 1% lidocaine  Estimated Blood Loss:  5 ml  Specimens: Scalp cyst, upper posterior Scalp cyst, lower posterior  Complications: None  Indications for Procedure:  This is a 76 y.o. female with diagnosis of multiple scalp cysts.  She was seen for this on 05/15/22.  The largest cyst is about the crown of the scalp, measuring 1.5 cm, which was excised on 05/29/22.  She has two others that are smaller in the upper posterior and lower posterior scalp.  She presents for excision of the remaining cysts. The risks of bleeding, abscess or infection, injury to surrounding structures, and need for further procedures were all discussed with the patient and she was willing to proceed.  Description of Procedure: The patient was correctly identified at bedside.  The patient was placed in sitting position.  Appropriate time-outs were performed.  The patient's upper and lower posterior scalp were prepped and draped in usual sterile fashion.  We started with the upper cyst.  Local anesthetic was infused intradermally.  A 1.5 cm incision was made over the cyst, and scalpel was used to dissect down the skin and subcutaneous tissue.  Skin flaps were created sharply, and then the cyst was excised intact.  It was sent off to pathology.  The cavity was then irrigated and hemostasis was assured with a 3-0 Vicryl.  The wound was then closed in two layers using 3-0 Vicryl and 4-0 Monocryl.  The incision was cleaned and sealed with DermaBond.  We then proceeded to the lower posterior cyst.  Local anesthetic was infused intradermally.  A 1 cm incision was made over the cyst, and scalpel was used to dissect down the skin and subcutaneous tissue.  Skin flaps were created sharply, and then the cyst was excised  intact.  It was sent off to pathology.  The cavity was then irrigated and hemostasis was assured with manual pressure.  The wound was then closed with 4-0 Monocryl.  The incision was cleaned and sealed with DermaBond  The patient tolerated the procedure well and all sharps were appropriately disposed of at the end of the case.  - Patient may shower tomorrow - May take Tylenol or ibuprofen for pain control. - Patient may resume her aspirin on 06/16/2022.  Melvyn Neth, MD

## 2022-06-14 NOTE — Patient Instructions (Signed)
If you have any concerns or questions, please feel free to call our office. See follow up appointment below.  Excision of Skin Lesions, Care After The following information offers guidance on how to care for yourself after your procedure. Your health care provider may also give you more specific instructions. If you have problems or questions, contact your health care provider. What can I expect after the procedure? After your procedure, it is common to have: Soreness or mild pain. Some redness and swelling. Follow these instructions at home: Excision site care  Follow instructions from your health care provider about how to take care of your excision site. Make sure you: Wash your hands with soap and water for at least 20 seconds before and after you change your bandage (dressing). If soap and water are not available, use hand sanitizer. Change your dressing as told by your health care provider. Leave stitches (sutures), skin glue, or adhesive strips in place. These skin closures may need to stay in place for 2 weeks or longer. If adhesive strip edges start to loosen and curl up, you may trim the loose edges. Do not remove adhesive strips completely unless your health care provider tells you to do that. Check the excision area every day for signs of infection. Watch for: More redness, swelling, or pain. Fluid or blood. Warmth. Pus or a bad smell. Keep the site clean, dry, and protected for at least 48 hours. For bleeding, apply gentle but firm pressure to the area using a folded towel for 20 minutes. Do not take baths, swim, or use a hot tub until your health care provider approves. Ask your health care provider if you may take showers. You may only be allowed to take sponge baths. General instructions Take over-the-counter and prescription medicines only as told by your health care provider. Follow instructions from your health care provider about how to minimize scarring. Scarring should  lessen over time. Avoid sun exposure until the area has healed. Use sunscreen to protect the area from the sun after it has healed. Avoid high-impact exercise and activities until the sutures are removed or the area heals. Keep all follow-up visits. This is important. Contact a health care provider if: You have more redness, swelling, or pain around your excision site. You have fluid or blood coming from your excision site. Your excision site feels warm to the touch. You have pus or a bad smell coming from your excision site. You have a fever. You have pain that does not improve in 2-3 days after your procedure. Get help right away if: You have bleeding that does not stop with pressure or a dressing. Your wound opens up. Summary Take over-the-counter and prescription medicines only as told by your health care provider. Change your dressing as told by your health care provider. Contact a health care provider if you have redness, swelling, pain, or other signs of infection around your excision site. Keep all follow-up visits. This is important. This information is not intended to replace advice given to you by your health care provider. Make sure you discuss any questions you have with your health care provider. Document Revised: 04/24/2021 Document Reviewed: 04/24/2021 Elsevier Patient Education  2023 [employer]  

## 2022-06-19 ENCOUNTER — Ambulatory Visit
Admission: RE | Admit: 2022-06-19 | Discharge: 2022-06-19 | Disposition: A | Discharge status: Home / Self Care | Payer: Medicare Other | Source: Ambulatory Visit | Attending: Primary Care | Admitting: Primary Care

## 2022-06-19 DIAGNOSIS — Z1231 Encounter for screening mammogram for malignant neoplasm of breast: Secondary | ICD-10-CM | POA: Diagnosis not present

## 2022-06-19 DIAGNOSIS — Z780 Asymptomatic menopausal state: Secondary | ICD-10-CM | POA: Diagnosis not present

## 2022-06-19 DIAGNOSIS — E2839 Other primary ovarian failure: Secondary | ICD-10-CM | POA: Insufficient documentation

## 2022-06-28 ENCOUNTER — Encounter: Payer: Self-pay | Admitting: Surgery

## 2022-06-28 ENCOUNTER — Ambulatory Visit (INDEPENDENT_AMBULATORY_CARE_PROVIDER_SITE_OTHER): Payer: Medicare Other | Admitting: Surgery

## 2022-06-28 VITALS — BP 164/87 | HR 71 | Temp 97.7°F | Wt 158.6 lb

## 2022-06-28 DIAGNOSIS — Z09 Encounter for follow-up examination after completed treatment for conditions other than malignant neoplasm: Secondary | ICD-10-CM | POA: Diagnosis not present

## 2022-06-28 DIAGNOSIS — L729 Follicular cyst of the skin and subcutaneous tissue, unspecified: Secondary | ICD-10-CM | POA: Diagnosis not present

## 2022-06-28 NOTE — Progress Notes (Signed)
06/28/2022  HPI: Kim Holloway is a 76 y.o. female s/p excision of posterior scalp cysts on 06/14/2022.  Patient presents today for follow-up.  She had a prior excision of a superior scalp cyst on 05/29/2022.  Patient reported she has been doing very well and denies any pain from any of the 3 incisions.  They are all healing well.  Vital signs: BP (!) 164/87   Pulse 71   Temp 97.7 F (36.5 C) (Oral)   Wt 158 lb 9.6 oz (71.9 kg)   SpO2 95%   BMI 26.39 kg/m    Physical Exam: Constitutional: No acute distress Scalp: All 3 incisions are healing well and are clean, dry, intact without any evidence of infection.  There is 1 suture knot protruding out of the corner of the middle incision which was removed today using scissors.  Assessment/Plan: This is a 76 y.o. female s/p excision of posterior scalp cysts and superior scalp cyst.  - Reviewed pathology results with the patient showing all cysts without any evidence of malignancy. - Patient is healing well without any complications. - Follow-up as needed.   Melvyn Neth, [facility]

## 2022-06-28 NOTE — Patient Instructions (Signed)
If you have any concerns or questions, please feel free to call our office. Follow up as needed.   Excision of Skin Lesions, Care After The following information offers guidance on how to care for yourself after your procedure. Your health care provider may also give you more specific instructions. If you have problems or questions, contact your health care provider. What can I expect after the procedure? After your procedure, it is common to have: Soreness or mild pain. Some redness and swelling. Follow these instructions at home: Excision site care  Follow instructions from your health care provider about how to take care of your excision site. Make sure you: Wash your hands with soap and water for at least 20 seconds before and after you change your bandage (dressing). If soap and water are not available, use hand sanitizer. Change your dressing as told by your health care provider. Leave stitches (sutures), skin glue, or adhesive strips in place. These skin closures may need to stay in place for 2 weeks or longer. If adhesive strip edges start to loosen and curl up, you may trim the loose edges. Do not remove adhesive strips completely unless your health care provider tells you to do that. Check the excision area every day for signs of infection. Watch for: More redness, swelling, or pain. Fluid or blood. Warmth. Pus or a bad smell. Keep the site clean, dry, and protected for at least 48 hours. For bleeding, apply gentle but firm pressure to the area using a folded towel for 20 minutes. Do not take baths, swim, or use a hot tub until your health care provider approves. Ask your health care provider if you may take showers. You may only be allowed to take sponge baths. General instructions Take over-the-counter and prescription medicines only as told by your health care provider. Follow instructions from your health care provider about how to minimize scarring. Scarring should lessen over  time. Avoid sun exposure until the area has healed. Use sunscreen to protect the area from the sun after it has healed. Avoid high-impact exercise and activities until the sutures are removed or the area heals. Keep all follow-up visits. This is important. Contact a health care provider if: You have more redness, swelling, or pain around your excision site. You have fluid or blood coming from your excision site. Your excision site feels warm to the touch. You have pus or a bad smell coming from your excision site. You have a fever. You have pain that does not improve in 2-3 days after your procedure. Get help right away if: You have bleeding that does not stop with pressure or a dressing. Your wound opens up. Summary Take over-the-counter and prescription medicines only as told by your health care provider. Change your dressing as told by your health care provider. Contact a health care provider if you have redness, swelling, pain, or other signs of infection around your excision site. Keep all follow-up visits. This is important. This information is not intended to replace advice given to you by your health care provider. Make sure you discuss any questions you have with your health care provider. Document Revised: 04/24/2021 Document Reviewed: 04/24/2021 Elsevier Patient Education  [place].

## 2022-07-09 ENCOUNTER — Other Ambulatory Visit: Payer: Self-pay | Admitting: Primary Care

## 2022-07-09 DIAGNOSIS — I10 Essential (primary) hypertension: Secondary | ICD-10-CM

## 2022-09-20 DIAGNOSIS — H35371 Puckering of macula, right eye: Secondary | ICD-10-CM | POA: Diagnosis not present

## 2022-11-01 ENCOUNTER — Encounter: Payer: Self-pay | Admitting: Primary Care

## 2022-11-01 ENCOUNTER — Ambulatory Visit: Payer: Medicare HMO | Admitting: Primary Care

## 2022-11-01 VITALS — BP 140/76 | HR 75 | Temp 98.1°F | Ht 65.0 in | Wt 163.0 lb

## 2022-11-01 DIAGNOSIS — E1165 Type 2 diabetes mellitus with hyperglycemia: Secondary | ICD-10-CM | POA: Diagnosis not present

## 2022-11-01 LAB — POCT GLYCOSYLATED HEMOGLOBIN (HGB A1C): Hemoglobin A1C: 6.4 % — AB (ref 4.0–5.6)

## 2022-11-01 LAB — MICROALBUMIN / CREATININE URINE RATIO
Creatinine,U: 50.2 mg/dL
Microalb Creat Ratio: 1.4 mg/g (ref 0.0–30.0)
Microalb, Ur: <0.7 mg/dL (ref 0.0–1.9)

## 2022-11-01 NOTE — Progress Notes (Signed)
Subjective:    Patient ID: Kim Holloway, female    DOB: 07-21-1946, 77 y.o.   MRN: [id number]  HPI  Kim Holloway is a very pleasant 77 y.o. female with a history of hypertension, type 2 diabetes, hyperlipidemia who presents today for follow up of diabetes.  Current medications include: None.  She is checking her blood glucose 0 times daily.  Last A1C: 6.2 in July 2023, 6.4 today. Last Eye Exam: UTD Last Foot Exam: Due Pneumonia Vaccination: 2016 Urine Microalbumin: Due Statin: rosuvastatin   Dietary changes since last visit: Increased stress with her son's medical problems.    Exercise: None. Walking 6 days weekly.   BP Readings from Last 3 Encounters:  11/01/22 (!) 140/76  06/28/22 (!) 164/87  06/14/22 (!) 161/88   Wt Readings from Last 3 Encounters:  11/01/22 163 lb (73.9 kg)  06/28/22 158 lb 9.6 oz (71.9 kg)  06/14/22 158 lb (71.7 kg)        Review of Systems  Cardiovascular:  Negative for chest pain.  Endocrine: Negative for polydipsia and polyuria.  Neurological:  Negative for dizziness and numbness.         Past Medical History:  Diagnosis Date   Abnormal breast biopsy 10/04/2013   right breast   Family history of adverse reaction to anesthesia 2016   brother had difficulty waking up after surgery   History of colonoscopy 2008   Hypertension    Mammographic calcification 06/2017   Proximal humerus fracture 03/15/2021   Left    Social History   Socioeconomic History   Marital status: Married    Spouse name: Not on file   Number of children: Not on file   Years of education: Not on file   Highest education level: Not on file  Occupational History   Not on file  Tobacco Use   Smoking status: Never   Smokeless tobacco: Never  Vaping Use   Vaping Use: Never used  Substance and Sexual Activity   Alcohol use: No   Drug use: No   Sexual activity: Not on file  Other Topics Concern   Not on file  Social History Narrative   Married.    Moved from [place]. Originally from [place].   Retired.      Social Determinants of Health   Financial Resource Strain: Low Risk  (02/22/2022)   Overall Financial Resource Strain (CARDIA)    Difficulty of Paying Living Expenses: Not hard at all  Food Insecurity: No Food Insecurity (02/22/2022)   Hunger Vital Sign    Worried About Running Out of Food in the Last Year: Never true    Ran Out of Food in the Last Year: Never true  Transportation Needs: No Transportation Needs (02/22/2022)   PRAPARE - Hydrologist (Medical): No    Lack of Transportation (Non-Medical): No  Physical Activity: Sufficiently Active (02/22/2022)   Exercise Vital Sign    Days of Exercise per Week: 7 days    Minutes of Exercise per Session: 50 min  Stress: No Stress Concern Present (02/22/2022)   [place]    Feeling of Stress : Not at all  Social Connections: Not on file  Intimate Partner Violence: Not At Risk (07/26/2019)   Humiliation, Afraid, Rape, and Kick questionnaire    Fear of Current or Ex-Partner: No    Emotionally Abused: No    Physically Abused: No    Sexually Abused: No  Past Surgical History:  Procedure Laterality Date   BREAST BIOPSY Right 2014   stereo bx benign calcs   BREAST BIOPSY Left ?   cyst   CATARACT EXTRACTION  01/19/2016, 02/19/2016   COLONOSCOPY  2008   CYST EXCISION Right 07/31/2017   Procedure: CYST REMOVAL RIGHT INDEX FINGER;  Surgeon: Hessie Knows, MD;  Location: [facility] ORS;  Service: Orthopedics;  Laterality: Right;   EYE SURGERY Left 12/2016   laser surgery    Family History  Problem Relation Age of Onset   Breast cancer Neg Hx     No Known Allergies  Current Outpatient Medications on File Prior to Visit  Medication Sig Dispense Refill   aspirin EC 81 MG tablet Take 81 mg by mouth daily.     Calcium Carb-Cholecalciferol (CALCIUM 600 + D PO) Take 2 tablets by mouth daily.      cholecalciferol (VITAMIN D) 1000 units tablet Take 1,000 Units by mouth daily.     irbesartan-hydrochlorothiazide (AVALIDE) 150-12.5 MG tablet TAKE 1 TABLET BY MOUTH ONCE  DAILY FOR HIGH BLOOD PRESSURE 90 tablet 2   rosuvastatin (CRESTOR) 5 MG tablet TAKE 1 TABLET BY MOUTH IN THE  EVENING FOR CHOLESTEROL 90 tablet 3   No current facility-administered medications on file prior to visit.    BP (!) 140/76   Pulse 75   Temp 98.1 F (36.7 C) (Temporal)   Ht '5\' 5"'$  (1.651 m)   Wt 163 lb (73.9 kg)   SpO2 95%   BMI 27.12 kg/m  Objective:   Physical Exam Cardiovascular:     Rate and Rhythm: Normal rate and regular rhythm.  Pulmonary:     Effort: Pulmonary effort is normal.     Breath sounds: Normal breath sounds.  Musculoskeletal:     Cervical back: Neck supple.  Skin:    General: Skin is warm and dry.           Assessment & Plan:  Type 2 diabetes mellitus with hyperglycemia, without long-term current use of insulin (HCC) Assessment & Plan: Controlled with A1C of 6.4 today.  Commended her on daily walking. Continue off medications.  Foot exam today.  Urine microalbumin due and pending today.  Follow up in 6 months.  Orders: -     POCT glycosylated hemoglobin (Hb A1C) -     Microalbumin / creatinine urine ratio        Pleas Koch, NP

## 2022-11-01 NOTE — Patient Instructions (Signed)
Please schedule a physical to meet with me in 6 months.   It was a pleasure to see you today!   

## 2022-11-01 NOTE — Assessment & Plan Note (Signed)
Controlled with A1C of 6.4 today.  Commended her on daily walking. Continue off medications.  Foot exam today.  Urine microalbumin due and pending today.  Follow up in 6 months.

## 2023-01-23 ENCOUNTER — Emergency Department: Payer: Medicare HMO

## 2023-01-23 ENCOUNTER — Telehealth: Payer: Self-pay | Admitting: Neurosurgery

## 2023-01-23 ENCOUNTER — Emergency Department
Admission: EM | Admit: 2023-01-23 | Discharge: 2023-01-23 | Disposition: A | Discharge status: Home / Self Care | Payer: Medicare HMO | Attending: Emergency Medicine | Admitting: Emergency Medicine

## 2023-01-23 ENCOUNTER — Other Ambulatory Visit: Payer: Self-pay

## 2023-01-23 DIAGNOSIS — Y9301 Activity, walking, marching and hiking: Secondary | ICD-10-CM | POA: Diagnosis not present

## 2023-01-23 DIAGNOSIS — W0110XA Fall on same level from slipping, tripping and stumbling with subsequent striking against unspecified object, initial encounter: Secondary | ICD-10-CM | POA: Diagnosis not present

## 2023-01-23 DIAGNOSIS — S0181XA Laceration without foreign body of other part of head, initial encounter: Secondary | ICD-10-CM | POA: Diagnosis not present

## 2023-01-23 DIAGNOSIS — Y92480 Sidewalk as the place of occurrence of the external cause: Secondary | ICD-10-CM | POA: Diagnosis not present

## 2023-01-23 DIAGNOSIS — I10 Essential (primary) hypertension: Secondary | ICD-10-CM | POA: Diagnosis not present

## 2023-01-23 DIAGNOSIS — S82034A Nondisplaced transverse fracture of right patella, initial encounter for closed fracture: Secondary | ICD-10-CM | POA: Diagnosis not present

## 2023-01-23 DIAGNOSIS — S065XAA Traumatic subdural hemorrhage with loss of consciousness status unknown, initial encounter: Secondary | ICD-10-CM

## 2023-01-23 DIAGNOSIS — S065X0A Traumatic subdural hemorrhage without loss of consciousness, initial encounter: Secondary | ICD-10-CM | POA: Insufficient documentation

## 2023-01-23 DIAGNOSIS — T07XXXA Unspecified multiple injuries, initial encounter: Secondary | ICD-10-CM

## 2023-01-23 DIAGNOSIS — E119 Type 2 diabetes mellitus without complications: Secondary | ICD-10-CM | POA: Diagnosis not present

## 2023-01-23 DIAGNOSIS — S82001A Unspecified fracture of right patella, initial encounter for closed fracture: Secondary | ICD-10-CM

## 2023-01-23 DIAGNOSIS — M25561 Pain in right knee: Secondary | ICD-10-CM | POA: Diagnosis present

## 2023-01-23 DIAGNOSIS — W19XXXA Unspecified fall, initial encounter: Secondary | ICD-10-CM

## 2023-01-23 MED ORDER — LIDOCAINE-EPINEPHRINE-TETRACAINE (LET) TOPICAL GEL
3.0000 mL | Freq: Once | TOPICAL | Status: AC
  Administered 2023-01-23: 3 mL via TOPICAL
  Filled 2023-01-23: qty 3

## 2023-01-23 NOTE — ED Notes (Signed)
AVS provided to and discussed with patient and family member at bedside. Pt verbalizes understanding of discharge instructions and denies any questions or concerns at this time. Pt has ride home. Pt taken out of department via W/C.  ? ?

## 2023-01-23 NOTE — Telephone Encounter (Signed)
Order has been placed for CT to be done around 02/17/23. I will work on the authorization.

## 2023-01-23 NOTE — ED Notes (Signed)
Wounds to L hand and L forehead cleaned and dressings placed. Bacitracin placed on both wounds, followed by nonadherent pads and gauze. Educated on keeping wounds clean and dry. Verbalized understanding.

## 2023-01-23 NOTE — Telephone Encounter (Signed)
Bridget Hartshorn PA from [facility] ER contacted Dr.Yarbrough becuase CT head showed possible small subdural and repeat shows the same.  Per Dr.Yarbrough- telephone call and repeat CT without contrast in about 4 weeks  Can someone please place the order. I will then contact patient.

## 2023-01-23 NOTE — Consult Note (Signed)
ORTHOPAEDIC CONSULTATION  REQUESTING PHYSICIAN: Jene Every, MD  Chief Complaint:   Right knee pain  History of Present Illness: Kim Holloway is a 77 y.o. female who had a trip and fall while walking her dog.  She noted significant pain about her right knee as well as her head.  Imaging in the emergency department revealed a minimally displaced right transverse patella fracture and a possible subdural hematoma.  Patient ambulates unassisted at baseline.  She lives at home with her husband.  Past Medical History:  Diagnosis Date   Abnormal breast biopsy 10/04/2013   right breast   Family history of adverse reaction to anesthesia 2016   brother had difficulty waking up after surgery   History of colonoscopy 2008   Hypertension    Mammographic calcification 06/2017   Proximal humerus fracture 03/15/2021   Left   Past Surgical History:  Procedure Laterality Date   BREAST BIOPSY Right 2014   stereo bx benign calcs   BREAST BIOPSY Left ?   cyst   CATARACT EXTRACTION  01/19/2016, 02/19/2016   COLONOSCOPY  2008   CYST EXCISION Right 07/31/2017   Procedure: CYST REMOVAL RIGHT INDEX FINGER;  Surgeon: Kennedy Bucker, MD;  Location: [facility] ORS;  Service: Orthopedics;  Laterality: Right;   EYE SURGERY Left 12/2016   laser surgery   Social History   Socioeconomic History   Marital status: Married    Spouse name: Not on file   Number of children: Not on file   Years of education: Not on file   Highest education level: Not on file  Occupational History   Not on file  Tobacco Use   Smoking status: Never   Smokeless tobacco: Never  Vaping Use   Vaping Use: Never used  Substance and Sexual Activity   Alcohol use: No   Drug use: No   Sexual activity: Not on file  Other Topics Concern   Not on file  Social History Narrative   Married.   Moved from [place]. Originally from [place].   Retired.      Social Determinants of  Health   Financial Resource Strain: Low Risk  (02/22/2022)   Overall Financial Resource Strain (CARDIA)    Difficulty of Paying Living Expenses: Not hard at all  Food Insecurity: No Food Insecurity (02/22/2022)   Hunger Vital Sign    Worried About Running Out of Food in the Last Year: Never true    Ran Out of Food in the Last Year: Never true  Transportation Needs: No Transportation Needs (02/22/2022)   PRAPARE - Administrator, Civil Service (Medical): No    Lack of Transportation (Non-Medical): No  Physical Activity: Sufficiently Active (02/22/2022)   Exercise Vital Sign    Days of Exercise per Week: 7 days    Minutes of Exercise per Session: 50 min  Stress: No Stress Concern Present (02/22/2022)   [employer] of Occupational Health - Occupational Stress Questionnaire    Feeling of Stress : Not at all  Social Connections: Not on file   Family History  Problem Relation Age of Onset   Breast cancer Neg Hx    No Known Allergies Prior to Admission medications   Medication Sig Start Date End Date Taking? Authorizing Provider  aspirin EC 81 MG tablet Take 81 mg by mouth daily.    [provider]  Calcium Carb-Cholecalciferol (CALCIUM 600 + D PO) Take 2 tablets by mouth daily.    [provider]  cholecalciferol (VITAMIN D)  1000 units tablet Take 1,000 Units by mouth daily.    [provider]  irbesartan-hydrochlorothiazide (AVALIDE) 150-12.5 MG tablet TAKE 1 TABLET BY MOUTH ONCE  DAILY FOR HIGH BLOOD PRESSURE 07/09/22   Doreene Nest, NP  rosuvastatin (CRESTOR) 5 MG tablet TAKE 1 TABLET BY MOUTH IN THE  EVENING FOR CHOLESTEROL 05/13/22   Doreene Nest, NP   No results for input(s): "WBC", "HGB", "HCT", "PLT", "K", "CL", "CO2", "BUN", "CREATININE", "GLUCOSE", "CALCIUM", "LABPT", "INR" in the last 72 hours. CT HEAD WO CONTRAST ( )  Result Date: 01/23/2023 CLINICAL DATA:  Fall. EXAM: CT HEAD WITHOUT CONTRAST CT CERVICAL SPINE WITHOUT  CONTRAST TECHNIQUE: Multidetector CT imaging of the head and cervical spine was performed following the standard protocol without intravenous contrast. Multiplanar CT image reconstructions of the cervical spine were also generated. RADIATION DOSE REDUCTION: This exam was performed according to the departmental dose-optimization program which includes automated exposure control, adjustment of the mA and/or kV according to patient size and/or use of iterative reconstruction technique. COMPARISON:  None Available. FINDINGS: CT HEAD FINDINGS Brain: Focal hyperdensity along the anterior falx. In the absence of available prior examinations for comparison, this is favored to represent dural calcification; however, a small interhemispheric subdural hematoma is possible. Gray-white differentiation is preserved. No hydrocephalus. No mass effect or midline shift. Vascular: No hyperdense vessel or unexpected calcification. Skull: No calvarial fracture or suspicious bone lesion. Skull base is unremarkable. Sinuses/Orbits: Mucous retention cyst in the left maxillary sinus. Orbits are unremarkable. Other: Soft tissue contusion along the left lateral orbit. CT CERVICAL SPINE FINDINGS Alignment: 2 mm degenerative anterolisthesis of C4 on C5. Skull base and vertebrae: No acute fracture. Normal craniocervical junction. No suspicious bone lesions. Soft tissues and spinal canal: No prevertebral fluid or swelling. No visible canal hematoma. Disc levels: Mild cervical spondylosis without high-grade spinal canal stenosis. Upper chest: Unremarkable. Other: None. IMPRESSION: 1. Focal hyperdensity along the anterior falx. In the absence of available prior examinations for comparison, this is favored to represent dural calcification; however, a small interhemispheric subdural hematoma is possible. Recommend short interval follow-up head CT to ensure stability. 2. Soft tissue contusion along the left lateral orbit. 3. No acute cervical spine  fracture or traumatic malalignment. Electronically Signed   By: Orvan Falconer M.D.   On: 01/23/2023 09:23   CT Cervical Spine Wo Contrast  Result Date: 01/23/2023 CLINICAL DATA:  Fall. EXAM: CT HEAD WITHOUT CONTRAST CT CERVICAL SPINE WITHOUT CONTRAST TECHNIQUE: Multidetector CT imaging of the head and cervical spine was performed following the standard protocol without intravenous contrast. Multiplanar CT image reconstructions of the cervical spine were also generated. RADIATION DOSE REDUCTION: This exam was performed according to the departmental dose-optimization program which includes automated exposure control, adjustment of the mA and/or kV according to patient size and/or use of iterative reconstruction technique. COMPARISON:  None Available. FINDINGS: CT HEAD FINDINGS Brain: Focal hyperdensity along the anterior falx. In the absence of available prior examinations for comparison, this is favored to represent dural calcification; however, a small interhemispheric subdural hematoma is possible. Gray-white differentiation is preserved. No hydrocephalus. No mass effect or midline shift. Vascular: No hyperdense vessel or unexpected calcification. Skull: No calvarial fracture or suspicious bone lesion. Skull base is unremarkable. Sinuses/Orbits: Mucous retention cyst in the left maxillary sinus. Orbits are unremarkable. Other: Soft tissue contusion along the left lateral orbit. CT CERVICAL SPINE FINDINGS Alignment: 2 mm degenerative anterolisthesis of C4 on C5. Skull base and vertebrae: No acute fracture. Normal craniocervical junction.  No suspicious bone lesions. Soft tissues and spinal canal: No prevertebral fluid or swelling. No visible canal hematoma. Disc levels: Mild cervical spondylosis without high-grade spinal canal stenosis. Upper chest: Unremarkable. Other: None. IMPRESSION: 1. Focal hyperdensity along the anterior falx. In the absence of available prior examinations for comparison, this is favored  to represent dural calcification; however, a small interhemispheric subdural hematoma is possible. Recommend short interval follow-up head CT to ensure stability. 2. Soft tissue contusion along the left lateral orbit. 3. No acute cervical spine fracture or traumatic malalignment. Electronically Signed   By: Orvan Falconer M.D.   On: 01/23/2023 09:23   DG Knee Complete 4 Views Right  Result Date: 01/23/2023 CLINICAL DATA:  Fall. EXAM: RIGHT KNEE - COMPLETE 4+ VIEW COMPARISON:  None Available. FINDINGS: Four views. Transverse fracture of the patella with associated prepatellar soft tissue swelling and moderate joint effusion. IMPRESSION: Transverse fracture of the patella with associated prepatellar soft tissue swelling and moderate joint effusion. Electronically Signed   By: Orvan Falconer M.D.   On: 01/23/2023 09:12     Positive ROS: All other systems have been reviewed and were otherwise negative with the exception of those mentioned in the HPI and as above.  Physical Exam: BP (!) 162/87   Pulse 88   Temp 98.1 F (36.7 C) (Oral)   Resp 16   SpO2 96%  General:  Alert, no acute distress Psychiatric:  Patient is competent for consent with normal mood and affect    Orthopedic Exam:  RLE: 5/5 DF/PF/EHL SILT s/s/t/sp/dp distr Foot wwp Tenderness to palpation about the patella.  Small abrasions present about the patella as well. Patient able to perform straight leg rais.   X-rays:  As above: Minimally displaced right transverse patella fracture  Assessment/Plan: 77 year old female with a minimally displaced right transverse patella fracture 1.  We discussed the fracture as well as treatment options.  We agreed to proceed with further nonsurgical management as the extensor mechanism appears to be intact.  2.  Patient may weight-bear as tolerated with the knee in extension.  May use knee immobilizer to keep leg locked in extension.  3.  Follow-up with [facility] clinic orthopedics in  approximately 1-2 weeks.   Signa Kell   01/23/2023 3:09 PM

## 2023-01-23 NOTE — ED Provider Notes (Addendum)
[facility] Provider Note    Event Date/Time   First MD Initiated Contact with Patient 01/23/23 0935     (approximate)   History   Fall   HPI  Kim Holloway is a 77 y.o. female   presents to the ED after a fall that occurred this morning while she was walking her dog.  Patient states that the sidewalk was uneven and she fell forward hitting her head but denies any loss of consciousness.  No headache, nausea, vomiting or visual changes at this time.  She denies any other injuries.  Patient complains of abrasions to the left side of her head and right knee pain.  Patient does take aspirin 81 mg daily.  She reports that her last tetanus immunization has been within the last 10 years.  Patient has history of hypertension, diabetes type 2, and glaucoma of the left eye.      Physical Exam   Triage Vital Signs: ED Triage Vitals [01/23/23 0843]  Enc Vitals Group     BP (!) 197/85     Pulse Rate 86     Resp 18     Temp 98 F (36.7 C)     Temp src      SpO2 95 %     Weight      Height      Head Circumference      Peak Flow      Pain Score 6     Pain Loc      Pain Edu?      Excl. in GC?     Most recent vital signs: Vitals:   01/23/23 1330 01/23/23 1545  BP: (!) 157/84 (!) 164/83  Pulse: 87 85  Resp:  18  Temp:  98 F (36.7 C)  SpO2:  96%     General: Awake, no distress.  Alert, talkative, answers questions appropriately.  Family member present and agrees.  Cranial nerves II to XII grossly intact. CV:  Good peripheral perfusion.  Heart regular rate and rhythm. Resp:  Normal effort.  Lungs are clear bilaterally.  No tenderness noted on palpation or compression of the ribs. Abd:  No distention.  Soft, nontender. Other:  Multiple abrasions are noted to the left side of the face with a small 1.5 cm laceration left forehead laterally without active bleeding.  No point tenderness is noted on palpation of cervical spine posteriorly.  Patient is able to  move upper and lower extremities without any difficulty with the exception of her right lower extremity.  Moderate tenderness on palpation of the right knee with mild effusion.  No pain with compression of the hips bilaterally.    ED Results / Procedures / Treatments   Labs (all labs ordered are listed, but only abnormal results are displayed) Labs Reviewed - No data to display  Critical care  75 minutes for continued neurological checks and reevaluation of the patient.  Consulting Dr. Allena Katz about the patella fracture.  Consulted Dr. Myer Haff about the subdural hematoma and follow-up CT scan to reevaluate after 6 hours.  During this time vital signs were monitored by myself and patient neurologically remained stable.  Vital signs remained stable.  Care plan was established during this time and also educating family on strict return precautions.   RADIOLOGY CT head without contrast per radiologist shows small interhemispheric subdural hematoma possible and suggested follow-up CT   PROCEDURES:  Critical Care performed:   Marland KitchenMarland KitchenLaceration Repair  Date/Time: 01/23/2023 1:31 PM  Performed by: Tommi Rumps, PA-C Authorized by: Tommi Rumps, PA-C   Consent:    Consent obtained:  Verbal   Consent given by:  Patient   Risks discussed:  Pain, poor cosmetic result and poor wound healing Universal protocol:    Patient identity confirmed:  Verbally with patient Anesthesia:    Anesthesia method:  Topical application   Topical anesthetic:  LET Laceration details:    Location:  Face   Face location:  Forehead   Length (cm):  1.5 Exploration:    Hemostasis achieved with:  LET and direct pressure   Contaminated: no   Treatment:    Area cleansed with:  Saline   Amount of cleaning:  Standard Skin repair:    Repair method:  Tissue adhesive Approximation:    Approximation:  Close Repair type:    Repair type:  Simple Post-procedure details:    Dressing:  Open (no dressing)    Procedure completion:  Tolerated    MEDICATIONS ORDERED IN ED: Medications  lidocaine-EPINEPHrine-tetracaine (LET) topical gel (3 mLs Topical Given 01/23/23 1031)     IMPRESSION / MDM / ASSESSMENT AND PLAN / ED COURSE  I reviewed the triage vital signs and the nursing notes.   Differential diagnosis includes, but is not limited to, head injury, cervical fracture, cervical sprain, multiple abrasions, laceration, fracture right knee, contusion right knee.  77 year old female presents to the ED after a fall that occurred this morning while walking her dog.  CT scan noted a possible small interhemispheric subdural hematoma.  Patient has multiple abrasions and a small laceration that was repaired while in the ED.  I spoke with Dr. Cyril Loosen and will be keeping the patient for a repeat CT scan in 6 hours for reevaluation of the possible subdural.  Dr. Signa Kell was consulted and came to see the patient while in the emergency department.  Patient has a right patella fracture and he will be following that up in the office.  A knee immobilizer was applied patient is to have a walker for use at home with little to no weightbearing on her right leg.  Consulted Dr.Yarbrough on this patient and her CT scan showing a 2 mm area concerning for a subdural.  Patient has remained neurologically intact, alert, oriented and vital signs are stable.  Dr. Myer Haff will follow this patient in the office and is making arrangements for a follow-up CT to be scheduled.  Patient was made aware that she will need to have to follow-up on with Dr. Signa Kell and  1 with Dr. Venetia Night and their contact information and phone numbers were given to her on her discharge papers.  Patient and family was made aware that she is return to the emergency department immediately if there is any change in her condition such as severe headache, vision changes, nausea, projectile vomiting or or any other urgent concerns.  She is to ice  and elevate her knee to reduce swelling.  A walker was given to her to use for support with very little weightbearing as possible.  She is aware that she should only take Tylenol and not anti-inflammatories for any pain.  She states that normally she takes Tylenol anyway.  Abrasions were dressed and patient is aware to watch for any signs of infection.     Patient's presentation is most consistent with acute presentation with potential threat to life or bodily function.  FINAL CLINICAL IMPRESSION(S) / ED DIAGNOSES   Final diagnoses:  Subdural hematoma  Closed nondisplaced fracture of right patella, unspecified fracture morphology, initial encounter  Facial laceration, initial encounter  Multiple abrasions  Fall, initial encounter     Rx / DC Orders   ED Discharge Orders     None        Note:  This document was prepared using Dragon voice recognition software and may include unintentional dictation errors.   Tommi Rumps, PA-C 01/23/23 1632    Tommi Rumps, PA-C 01/23/23 1634    Jene Every, MD 01/27/23 [phone]

## 2023-01-23 NOTE — ED Triage Notes (Signed)
Pt comes with c/o trip and fall while walking the dog. Pt fell forward on concrete. No loc or thinners. Pt states head pain and right knee pain.

## 2023-01-23 NOTE — Discharge Instructions (Addendum)
You will need to have 2 follow-ups scheduled.  Dr. Myer Haff is the neurosurgeon on-call and he is arranging for you to have a follow-up CT scan and also follow him in the office for your injury.  Return to the emergency department for any severe worsening such as headache, vomiting, vision changes or dizziness.  The second at follow-up will be with Dr. Allena Katz who is the orthopedist that saw you in the emergency department.  You will need to wear the knee immobilizer and use the walker for added support.  Ice and elevation to your knee to help with swelling which will also help with pain.  The abrasions that you have need to be cleaned daily with mild soap and water with with the exception of where the glue was applied to your face.  Let the glue fall off on its own which should be in about 5 to 7 days.   You may take Tylenol for pain as needed.  Do not take anti-inflammatory such as Advil, Motrin or Aleve.

## 2023-01-24 ENCOUNTER — Telehealth: Payer: Self-pay

## 2023-01-24 NOTE — Telephone Encounter (Addendum)
Noted. Follow up with neurosurgery and orthopedics as planned.

## 2023-01-24 NOTE — Transitions of Care (Post Inpatient/ED Visit) (Signed)
Pt was seen at [facility] ED on 01/23/23 after a fall while walking her dog. Pt fx rt patella and subdural hematoma. Pt was given knee immobilizer and walker at ED. Pt does not have any H/A,dizziness vision changes or vomiting. pt is waiting for call from ortho for appt in office and neurosurgery about CT scan appt. Pt does not feel needs appt with PCP at this time but if condition changes will call [facility] for appt and UC & ED precautions given and pt voiced understanding. Sending note to Allayne Gitelman NP.       01/24/2023  Name: Kim Holloway MRN: [id number] DOB: [birth year]  Today's TOC FU Call Status: Today's TOC FU Call Status:: Successful TOC FU Call Competed TOC FU Call Complete Date: 01/24/23  Transition Care Management Follow-up Telephone Call Date of Discharge: 01/23/23 Discharge Facility: [facility]) Type of Discharge: Emergency Department Reason for ED Visit: Other: (fx of patella and subdural hematoma) How have you been since you were released from the hospital?: Better Any questions or concerns?: No  Items Reviewed: Did you receive and understand the discharge instructions provided?: Yes Medications obtained and verified?: Yes (Medications Reviewed) (pt to take tylenol for pain prn) Any new allergies since your discharge?: No Dietary orders reviewed?: NA Do you have support at home?: Yes People in Home: spouse Name of Support/Comfort Primary Source: [facility] and Equipment/Supplies: Were Home Health Services Ordered?: No Any new equipment or medical supplies ordered?: Yes Name of Medical supply agency?: walker provided at ED and pt is comfortable in using the walker at home. Were you able to get the equipment/medical supplies?: Yes Do you have any questions related to the use of the equipment/supplies?: No  Functional Questionnaire: Do you need assistance with bathing/showering or dressing?: No Do you need assistance with meal preparation?: Yes  (pts husband and neighbors are preparing meals for pt) Do you need assistance with eating?: No Do you have difficulty maintaining continence: No Do you need assistance with getting out of bed/getting out of a chair/moving?: Yes (pt said depending on the chair pt may need assistance getting up but other than that pt does not need assistance with moving around other than use of walker.) Do you have difficulty managing or taking your medications?: No  Follow up appointments reviewed: PCP Follow-up appointment confirmed?: No MD Provider Line Number:[phone] Given: Yes Specialist Hospital Follow-up appointment confirmed?: Yes Date of Specialist follow-up appointment?:  (pt is waiting for call from ortho for appt in office and neurosurgery about CT scan appt.) Follow-Up Specialty Provider:: Dr Signa Kell ortho and Dr Venetia Night Do you need transportation to your follow-up appointment?: No Do you understand care options if your condition(s) worsen?: Yes-patient verbalized understanding    SIGNATURE Lewanda Rife, LPN

## 2023-01-24 NOTE — Telephone Encounter (Signed)
Patient has been notified to scheduled CT and them I will call her back with a telephone visit

## 2023-01-27 NOTE — Telephone Encounter (Signed)
CT 02/20/23 Telephone visit 02/25/2023

## 2023-01-30 ENCOUNTER — Ambulatory Visit (INDEPENDENT_AMBULATORY_CARE_PROVIDER_SITE_OTHER): Payer: Medicare HMO | Admitting: Primary Care

## 2023-01-30 ENCOUNTER — Encounter: Payer: Self-pay | Admitting: Primary Care

## 2023-01-30 VITALS — BP 134/86 | HR 80 | Temp 98.6°F | Ht 65.0 in | Wt 168.0 lb

## 2023-01-30 DIAGNOSIS — S82031S Displaced transverse fracture of right patella, sequela: Secondary | ICD-10-CM

## 2023-01-30 DIAGNOSIS — S065XAA Traumatic subdural hemorrhage with loss of consciousness status unknown, initial encounter: Secondary | ICD-10-CM | POA: Diagnosis not present

## 2023-01-30 DIAGNOSIS — S82009A Unspecified fracture of unspecified patella, initial encounter for closed fracture: Secondary | ICD-10-CM | POA: Insufficient documentation

## 2023-01-30 HISTORY — DX: Unspecified fracture of unspecified patella, initial encounter for closed fracture: S82.009A

## 2023-01-30 HISTORY — DX: Traumatic subdural hemorrhage with loss of consciousness status unknown, initial encounter: S06.5XAA

## 2023-01-30 NOTE — Assessment & Plan Note (Signed)
Reviewed ED notes, labs, imaging.  Continue to avoid weight bearing movement. Continue to use immobilizer, ice, elevation, rest.  Follow up with orthopedics tomorrow.

## 2023-01-30 NOTE — Assessment & Plan Note (Signed)
Secondary to fall.  Reviewed ED notes, labs, imaging. Neuro exam today stable. No alarm signs.  Follow up with neurosurgery for repeat CT head as scheduled.

## 2023-01-30 NOTE — Progress Notes (Signed)
Subjective:    Patient ID: Kim Holloway, female    DOB: October 06, 1946, 77 y.o.   MRN: [id number]  HPI  Kim Holloway is a very pleasant 77 y.o. female with a history of hypertension, type 2 diabetes, glaucoma who presents today for ED follow up.  Her husband joins [place] today.  She presented to [facility] ED on 01/23/23 for evaluation of a fall that occurred earlier that morning. She was walking her dog, the sidewalk was uneven, and she fell forward hitting her head without loss of consciousness. She did sustain abrasions to the left side of her head and right knee.   During her ED visit she underwent CT scan of the head which revealed possible small interhemispheric subdural hematoma. She was held for 6 hours and underwent repeat CT head which 2 mm area concerning for subdural bleeding. Neurosurgery consulted who recommended outpatient follow up and repeat CT head.   She also underwent xrays which revealed right patellar fracture. A knee immobilizer was applied and she was provided with a walker. Orthopedics evaluated patient and a decision for non surgical management was made. Her left head abrasion was glued shut. She was discharged home later that day with recommendations for both neurosurgery and orthopedic surgery follow up.  Since her ED visit she's feeling okay. She's trying to rest as much as possible, trying to avoid weight bearing activities. She denies headaches, changes in her vision. Her right knee is swollen and painful but she's applying ice.  She has an appointment scheduled for CT head on 02/20/23. She has an appointment with orthopedics scheduled for tomorrow. She is using her Rolator walker. She does notice dizziness when first waking up to get out of bed or if changing positions too quickly.    Review of Systems  Eyes:  Negative for visual disturbance.  Musculoskeletal:  Positive for arthralgias.  Skin:  Positive for color change.  Neurological:  Positive for dizziness. Negative  for headaches.         Past Medical History:  Diagnosis Date   Abnormal breast biopsy 10/04/2013   right breast   Family history of adverse reaction to anesthesia 2016   brother had difficulty waking up after surgery   History of colonoscopy 2008   Hypertension    Mammographic calcification 06/2017   Proximal humerus fracture 03/15/2021   Left    Social History   Socioeconomic History   Marital status: Married    Spouse name: Not on file   Number of children: Not on file   Years of education: Not on file   Highest education level: GED or equivalent  Occupational History   Not on file  Tobacco Use   Smoking status: Never   Smokeless tobacco: Never  Vaping Use   Vaping Use: Never used  Substance and Sexual Activity   Alcohol use: No   Drug use: No   Sexual activity: Not on file  Other Topics Concern   Not on file  Social History Narrative   Married.   Moved from [place]. Originally from [place].   Retired.      Social Determinants of Health   Financial Resource Strain: Low Risk  (01/28/2023)   Overall Financial Resource Strain (CARDIA)    Difficulty of Paying Living Expenses: Not very hard  Food Insecurity: No Food Insecurity (01/28/2023)   Hunger Vital Sign    Worried About Running Out of Food in the Last Year: Never true    Ran Out of Food in  the Last Year: Never true  Transportation Needs: No Transportation Needs (01/28/2023)   PRAPARE - Administrator, Civil Service (Medical): No    Lack of Transportation (Non-Medical): No  Physical Activity: Sufficiently Active (01/28/2023)   Exercise Vital Sign    Days of Exercise per Week: 7 days    Minutes of Exercise per Session: 60 min  Stress: No Stress Concern Present (01/28/2023)   [employer] of Occupational Health - Occupational Stress Questionnaire    Feeling of Stress : Not at all  Social Connections: Unknown (01/28/2023)   Social Connection and Isolation Panel [NHANES]    Frequency of  Communication with Friends and Family: Three times a week    Frequency of Social Gatherings with Friends and Family: More than three times a week    Attends Religious Services: Patient declined    Active Member of Clubs or Organizations: No    Attends Engineer, structural: Not on file    Marital Status: Married  Catering manager Violence: Not At Risk (07/26/2019)   Humiliation, Afraid, Rape, and Kick questionnaire    Fear of Current or Ex-Partner: No    Emotionally Abused: No    Physically Abused: No    Sexually Abused: No    Past Surgical History:  Procedure Laterality Date   BREAST BIOPSY Right 2014   stereo bx benign calcs   BREAST BIOPSY Left ?   cyst   CATARACT EXTRACTION  01/19/2016, 02/19/2016   COLONOSCOPY  2008   CYST EXCISION Right 07/31/2017   Procedure: CYST REMOVAL RIGHT INDEX FINGER;  Surgeon: Kennedy Bucker, MD;  Location: [facility] ORS;  Service: Orthopedics;  Laterality: Right;   EYE SURGERY Left 12/2016   laser surgery    Family History  Problem Relation Age of Onset   Breast cancer Neg Hx     No Known Allergies  Current Outpatient Medications on File Prior to Visit  Medication Sig Dispense Refill   aspirin EC 81 MG tablet Take 81 mg by mouth daily.     Calcium Carb-Cholecalciferol (CALCIUM 600 + D PO) Take 2 tablets by mouth daily.     cholecalciferol (VITAMIN D) 1000 units tablet Take 1,000 Units by mouth daily.     irbesartan-hydrochlorothiazide (AVALIDE) 150-12.5 MG tablet TAKE 1 TABLET BY MOUTH ONCE  DAILY FOR HIGH BLOOD PRESSURE 90 tablet 2   rosuvastatin (CRESTOR) 5 MG tablet TAKE 1 TABLET BY MOUTH IN THE  EVENING FOR CHOLESTEROL 90 tablet 3   No current facility-administered medications on file prior to visit.    BP 134/86   Pulse 80   Temp 98.6 F (37 C) (Temporal)   Ht 5\' 5"  (1.651 m)   Wt 168 lb (76.2 kg)   SpO2 98%   BMI 27.96 kg/m  Objective:   Physical Exam Cardiovascular:     Rate and Rhythm: Normal rate and regular rhythm.   Pulmonary:     Effort: Pulmonary effort is normal.     Breath sounds: Normal breath sounds.  Musculoskeletal:     Cervical back: Neck supple.     Comments: Right knee immobilizer in place  Skin:    General: Skin is warm and dry.     Comments: Scabbed abrasion healing to left temporal region  Healing wound to left palmer hand  Neurological:     Mental Status: She is alert and oriented to person, place, and time.     Cranial Nerves: No cranial nerve deficit.     Coordination:  Coordination normal.           Assessment & Plan:  Subdural hematoma Assessment & Plan: Secondary to fall.  Reviewed ED notes, labs, imaging. Neuro exam today stable. No alarm signs.  Follow up with neurosurgery for repeat CT head as scheduled.    Closed displaced transverse fracture of right patella, sequela Assessment & Plan: Reviewed ED notes, labs, imaging.  Continue to avoid weight bearing movement. Continue to use immobilizer, ice, elevation, rest.  Follow up with orthopedics tomorrow.          Doreene Nest, NP

## 2023-01-30 NOTE — Patient Instructions (Signed)
Follow up with orthopedics as scheduled.  Complete the CT scan in May.  Try to avoid putting weight on your knee.   It was a pleasure to see you today!

## 2023-02-09 ENCOUNTER — Other Ambulatory Visit: Payer: Self-pay | Admitting: Primary Care

## 2023-02-09 DIAGNOSIS — I10 Essential (primary) hypertension: Secondary | ICD-10-CM

## 2023-02-20 ENCOUNTER — Ambulatory Visit
Admission: RE | Admit: 2023-02-20 | Discharge: 2023-02-20 | Disposition: A | Discharge status: Home / Self Care | Payer: Medicare HMO | Source: Ambulatory Visit | Attending: Neurosurgery | Admitting: Neurosurgery

## 2023-02-20 DIAGNOSIS — S065XAA Traumatic subdural hemorrhage with loss of consciousness status unknown, initial encounter: Secondary | ICD-10-CM | POA: Insufficient documentation

## 2023-02-25 ENCOUNTER — Ambulatory Visit: Payer: Medicare HMO | Admitting: Neurosurgery

## 2023-02-25 DIAGNOSIS — S065XAA Traumatic subdural hemorrhage with loss of consciousness status unknown, initial encounter: Secondary | ICD-10-CM

## 2023-02-25 NOTE — Progress Notes (Signed)
Kim Holloway presents today to review her imaging.  She has been doing well.  Her new CT scan shows no acute findings.  Her intracranial hemorrhage has resolved.  I do not think she has any other significant findings on her CT scan.  I suspect that she will fully recover.  Will see her back on an as-needed basis.

## 2023-04-10 ENCOUNTER — Other Ambulatory Visit: Payer: Self-pay | Admitting: Primary Care

## 2023-04-10 DIAGNOSIS — E785 Hyperlipidemia, unspecified: Secondary | ICD-10-CM

## 2023-04-15 ENCOUNTER — Encounter: Payer: Self-pay | Admitting: Primary Care

## 2023-04-15 LAB — HM DIABETES EYE EXAM

## 2023-04-30 ENCOUNTER — Ambulatory Visit: Payer: Medicare HMO

## 2023-04-30 VITALS — Ht 65.0 in | Wt 170.0 lb

## 2023-04-30 DIAGNOSIS — Z0000 Encounter for general adult medical examination without abnormal findings: Secondary | ICD-10-CM

## 2023-04-30 DIAGNOSIS — Z1231 Encounter for screening mammogram for malignant neoplasm of breast: Secondary | ICD-10-CM

## 2023-04-30 NOTE — Patient Instructions (Addendum)
Kim Holloway , Thank you for taking time to come for your Medicare Wellness Visit. I appreciate your ongoing commitment to your health goals. Please review the following plan we discussed and let me know if I can assist you in the future.   These are the goals we discussed:  Goals      Increase physical activity     Starting 06/26/2018, I will continue to exercise for 60 minutes 7 days per week.      Patient Stated     07/26/2019, I will maintain and continue medications as prescribed.      Patient Stated     02/22/2022, wants to lose some weight     Patient Stated     Lose weight.        This is a list of the screening recommended for you and due dates:  Health Maintenance  Topic Date Due   Medicare Annual Wellness Visit  02/23/2023   Yearly kidney function blood test for diabetes  05/02/2023   Flu Shot  07/06/2049*   Hemoglobin A1C  05/02/2023   DTaP/Tdap/Td vaccine (2 - Td or Tdap) 07/08/2023   Yearly kidney health urinalysis for diabetes  11/02/2023   Complete foot exam   11/02/2023   Eye exam for diabetics  04/14/2024   Pneumonia Vaccine  Completed   DEXA scan (bone density measurement)  Completed   Hepatitis C Screening  Completed   HPV Vaccine  Aged Out   COVID-19 Vaccine  Discontinued   Cologuard (Stool DNA test)  Discontinued   Zoster (Shingles) Vaccine  Discontinued  *Topic was postponed. The date shown is not the original due date.   You have an order for:  []   2D Mammogram  [x]   3D Mammogram  []   Bone Density     Please call for appointment:  [facility]  [address] [place] [address] [phone]  [facility] [address] [place], [place] [address] [phone]  [facility] [address] Geanie Logan [place], [place] [address] [phone]    Make sure to wear two-piece clothing.  No lotions, powders, or deodorants  the day of the appointment. Make sure to bring picture ID and insurance card.  Bring list of medications you are currently taking including any supplements.   Schedule your [place] screening mammogram through MyChart!   Log into your MyChart account.  Go to 'Visit' (or 'Appointments' if on mobile App) --> Schedule an Appointment  Under 'Select a Reason for Visit' choose the Mammogram Screening option.  Complete the pre-visit questions and select the time and place that best fits your schedule.    Advanced directives: Please bring a copy of your health care power of attorney and living will to the office to be added to your chart at your convenience.   Conditions/risks identified: Aim for 30 minutes of exercise or brisk walking, 6-8 glasses of water, and 5 servings of fruits and vegetables each day.   Next appointment: Follow up in one year for your annual wellness visit 05/04/24 @ 9:15 televisit   Preventive Care 65 Years and Older, Female Preventive care refers to lifestyle choices and visits with your health care provider that can promote health and wellness. What does preventive care include? A yearly physical exam. This is also called an annual well check. Dental exams once or twice a year. Routine eye exams. Ask  your health care provider how often you should have your eyes checked. Personal lifestyle choices, including: Daily care of your teeth and gums. Regular physical activity. Eating a healthy diet. Avoiding tobacco and drug use. Limiting alcohol use. Practicing safe sex. Taking low-dose aspirin every day. Taking vitamin and mineral supplements as recommended by your health care provider. What happens during an annual well check? The services and screenings done by your health care provider during your annual well check will depend on your age, overall health, lifestyle risk factors, and family history of disease. Counseling  Your health care provider may ask you  questions about your: Alcohol use. Tobacco use. Drug use. Emotional well-being. Home and relationship well-being. Sexual activity. Eating habits. History of falls. Memory and ability to understand (cognition). Work and work Astronomer. Reproductive health. Screening  You may have the following tests or measurements: Height, weight, and BMI. Blood pressure. Lipid and cholesterol levels. These may be checked every 5 years, or more frequently if you are over 90 years old. Skin check. Lung cancer screening. You may have this screening every year starting at age 33 if you have a 30-pack-year history of smoking and currently smoke or have quit within the past 15 years. Fecal occult blood test (FOBT) of the stool. You may have this test every year starting at age 93. Flexible sigmoidoscopy or colonoscopy. You may have a sigmoidoscopy every 5 years or a colonoscopy every 10 years starting at age 13. Hepatitis C blood test. Hepatitis B blood test. Sexually transmitted disease (STD) testing. Diabetes screening. This is done by checking your blood sugar (glucose) after you have not eaten for a while (fasting). You may have this done every 1-3 years. Bone density scan. This is done to screen for osteoporosis. You may have this done starting at age 45. Mammogram. This may be done every 1-2 years. Talk to your health care provider about how often you should have regular mammograms. Talk with your health care provider about your test results, treatment options, and if necessary, the need for more tests. Vaccines  Your health care provider may recommend certain vaccines, such as: Influenza vaccine. This is recommended every year. Tetanus, diphtheria, and acellular pertussis (Tdap, Td) vaccine. You may need a Td booster every 10 years. Zoster vaccine. You may need this after age 34. Pneumococcal 13-valent conjugate (PCV13) vaccine. One dose is recommended after age 83. Pneumococcal polysaccharide  (PPSV23) vaccine. One dose is recommended after age 62. Talk to your health care provider about which screenings and vaccines you need and how often you need them. This information is not intended to replace advice given to you by your health care provider. Make sure you discuss any questions you have with your health care provider. Document Released: 10/20/2015 Document Revised: 06/12/2016 Document Reviewed: 07/25/2015 Elsevier Interactive Patient Education  2017 [employer].  Fall Prevention in the Home Falls can cause injuries. They can happen to people of all ages. There are many things you can do to make your home safe and to help prevent falls. What can I do on the outside of my home? Regularly fix the edges of walkways and driveways and fix any cracks. Remove anything that might make you trip as you walk through a door, such as a raised step or threshold. Trim any bushes or trees on the path to your home. Use bright outdoor lighting. Clear any walking paths of anything that might make someone trip, such as rocks or tools. Regularly check to see  if handrails are loose or broken. Make sure that both sides of any steps have handrails. Any raised decks and porches should have guardrails on the edges. Have any leaves, snow, or ice cleared regularly. Use sand or salt on walking paths during winter. Clean up any spills in your garage right away. This includes oil or grease spills. What can I do in the bathroom? Use night lights. Install grab bars by the toilet and in the tub and shower. Do not use towel bars as grab bars. Use non-skid mats or decals in the tub or shower. If you need to sit down in the shower, use a plastic, non-slip stool. Keep the floor dry. Clean up any water that spills on the floor as soon as it happens. Remove soap buildup in the tub or shower regularly. Attach bath mats securely with double-sided non-slip rug tape. Do not have throw rugs and other things on the  floor that can make you trip. What can I do in the bedroom? Use night lights. Make sure that you have a light by your bed that is easy to reach. Do not use any sheets or blankets that are too big for your bed. They should not hang down onto the floor. Have a firm chair that has side arms. You can use this for support while you get dressed. Do not have throw rugs and other things on the floor that can make you trip. What can I do in the kitchen? Clean up any spills right away. Avoid walking on wet floors. Keep items that you use a lot in easy-to-reach places. If you need to reach something above you, use a strong step stool that has a grab bar. Keep electrical cords out of the way. Do not use floor polish or wax that makes floors slippery. If you must use wax, use non-skid floor wax. Do not have throw rugs and other things on the floor that can make you trip. What can I do with my stairs? Do not leave any items on the stairs. Make sure that there are handrails on both sides of the stairs and use them. Fix handrails that are broken or loose. Make sure that handrails are as long as the stairways. Check any carpeting to make sure that it is firmly attached to the stairs. Fix any carpet that is loose or worn. Avoid having throw rugs at the top or bottom of the stairs. If you do have throw rugs, attach them to the floor with carpet tape. Make sure that you have a light switch at the top of the stairs and the bottom of the stairs. If you do not have them, ask someone to add them for you. What else can I do to help prevent falls? Wear shoes that: Do not have high heels. Have rubber bottoms. Are comfortable and fit you well. Are closed at the toe. Do not wear sandals. If you use a stepladder: Make sure that it is fully opened. Do not climb a closed stepladder. Make sure that both sides of the stepladder are locked into place. Ask someone to hold it for you, if possible. Clearly mark and make  sure that you can see: Any grab bars or handrails. First and last steps. Where the edge of each step is. Use tools that help you move around (mobility aids) if they are needed. These include: Canes. Walkers. Scooters. Crutches. Turn on the lights when you go into a dark area. Replace any light bulbs as soon  as they burn out. Set up your furniture so you have a clear path. Avoid moving your furniture around. If any of your floors are uneven, fix them. If there are any pets around you, be aware of where they are. Review your medicines with your doctor. Some medicines can make you feel dizzy. This can increase your chance of falling. Ask your doctor what other things that you can do to help prevent falls. This information is not intended to replace advice given to you by your health care provider. Make sure you discuss any questions you have with your health care provider. Document Released: 07/20/2009 Document Revised: 02/29/2016 Document Reviewed: 10/28/2014 Elsevier Interactive Patient Education  2017 [employer].

## 2023-04-30 NOTE — Progress Notes (Addendum)
Subjective:   Kim Holloway is a 77 y.o. female who presents for Medicare Annual (Subsequent) preventive examination.  Visit Complete: Virtual  I connected with  Alvino Chapel on 04/30/23 by a audio enabled telemedicine application and verified that I am speaking with the correct person using two identifiers.  Patient Location: Home  Provider Location: Home Office  I discussed the limitations of evaluation and management by telemedicine. The patient expressed understanding and agreed to proceed.  Patient Medicare AWV questionnaire was completed by the patient on 04/25/23; I have confirmed that all information answered by patient is correct and no changes since this date.  Review of Systems      Cardiac Risk Factors include: advanced age (>53men, >110 women);dyslipidemia;hypertension;diabetes mellitus  Per patient no change in vitals since last visit; unable to obtain new vitals due to this being a telehealth visit.   Patient was unable to self-report vital signs via telehealth due to a lack of equipment at home.     Objective:    Today's Vitals   04/30/23 1127  Weight: 170 lb (77.1 kg)  Height: 5\' 5"  (1.651 m)   Body mass index is 28.29 kg/m.     04/30/2023   11:33 AM 02/22/2022    2:59 PM 07/26/2019    9:21 AM 06/26/2018   10:50 AM 07/31/2017    6:06 AM 07/21/2017    1:29 PM 06/23/2017    1:16 PM  Advanced Directives  Does Patient Have a Medical Advance Directive? No No No No No No Yes  Type of Tax inspector;Living will  Does patient want to make changes to medical advance directive?       Yes (MAU/Ambulatory/Procedural Areas - Information given)  Copy of Healthcare Power of Attorney in Chart?       No - copy requested  Would patient like information on creating a medical advance directive? No - Patient declined  No - Patient declined No - Patient declined No - Patient declined      Current Medications (verified) Outpatient  Encounter Medications as of 04/30/2023  Medication Sig   aspirin EC 81 MG tablet Take 81 mg by mouth daily.   Calcium Carb-Cholecalciferol (CALCIUM 600 + D PO) Take 2 tablets by mouth daily.   cholecalciferol (VITAMIN D) 1000 units tablet Take 1,000 Units by mouth daily.   irbesartan-hydrochlorothiazide (AVALIDE) 150-12.5 MG tablet TAKE 1 TABLET BY MOUTH ONCE  DAILY FOR HIGH BLOOD PRESSURE   rosuvastatin (CRESTOR) 5 MG tablet TAKE 1 TABLET BY MOUTH IN THE  EVENING FOR CHOLESTEROL   No facility-administered encounter medications on file as of 04/30/2023.    Allergies (verified) Patient has no known allergies.   History: Past Medical History:  Diagnosis Date   Abnormal breast biopsy 10/04/2013   right breast   Family history of adverse reaction to anesthesia 2016   brother had difficulty waking up after surgery   History of colonoscopy 2008   Hypertension    Mammographic calcification 06/2017   Proximal humerus fracture 03/15/2021   Left   Past Surgical History:  Procedure Laterality Date   BREAST BIOPSY Right 2014   stereo bx benign calcs   BREAST BIOPSY Left ?   cyst   CATARACT EXTRACTION  01/19/2016, 02/19/2016   COLONOSCOPY  2008   CYST EXCISION Right 07/31/2017   Procedure: CYST REMOVAL RIGHT INDEX FINGER;  Surgeon: Kennedy Bucker, MD;  Location: [facility] ORS;  Service: Orthopedics;  Laterality:  Right;   EYE SURGERY Left 12/2016   laser surgery   Family History  Problem Relation Age of Onset   Breast cancer Neg Hx    Social History   Socioeconomic History   Marital status: Married    Spouse name: Not on file   Number of children: Not on file   Years of education: Not on file   Highest education level: GED or equivalent  Occupational History   Not on file  Tobacco Use   Smoking status: Never   Smokeless tobacco: Never  Vaping Use   Vaping status: Never Used  Substance and Sexual Activity   Alcohol use: No   Drug use: No   Sexual activity: Not on file  Other  Topics Concern   Not on file  Social History Narrative   Married.   Moved from [place]. Originally from [place].   Retired.      Social Determinants of Health   Financial Resource Strain: Low Risk  (04/30/2023)   Overall Financial Resource Strain (CARDIA)    Difficulty of Paying Living Expenses: Not hard at all  Food Insecurity: No Food Insecurity (04/30/2023)   Hunger Vital Sign    Worried About Running Out of Food in the Last Year: Never true    Ran Out of Food in the Last Year: Never true  Transportation Needs: No Transportation Needs (04/30/2023)   PRAPARE - Administrator, Civil Service (Medical): No    Lack of Transportation (Non-Medical): No  Physical Activity: Sufficiently Active (04/30/2023)   Exercise Vital Sign    Days of Exercise per Week: 6 days    Minutes of Exercise per Session: 60 min  Stress: No Stress Concern Present (04/30/2023)   [employer] of Occupational Health - Occupational Stress Questionnaire    Feeling of Stress : Not at all  Social Connections: Moderately Integrated (04/30/2023)   Social Connection and Isolation Panel [NHANES]    Frequency of Communication with Friends and Family: More than three times a week    Frequency of Social Gatherings with Friends and Family: More than three times a week    Attends Religious Services: More than 4 times per year    Active Member of [employer] or Organizations: No    Attends Engineer, structural: Never    Marital Status: Married    Tobacco Counseling Counseling given: Not Answered   Clinical Intake:  Pre-visit preparation completed: Yes  Pain : No/denies pain     BMI - recorded: 28.29 Nutritional Status: BMI 25 -29 Overweight Nutritional Risks: Unintentional weight gain (gained some weight after breaking knee) Diabetes: No (pre-diabetic per pt) CBG done?: No Did pt. bring in CBG monitor from home?: No  How often do you need to have someone help you when you read instructions,  pamphlets, or other written materials from your doctor or pharmacy?: 1 - Never  Interpreter Needed?: No  Information entered by :: C.Seraya Jobst LPN   Activities of Daily Living    04/25/2023   10:21 AM  In your present state of health, do you have any difficulty performing the following activities:  Hearing? 0  Vision? 0  Difficulty concentrating or making decisions? 0  Walking or climbing stairs? 0  Dressing or bathing? 0  Doing errands, shopping? 0  Preparing Food and eating ? N  Using the Toilet? N  In the past six months, have you accidently leaked urine? N  Do you have problems with loss of bowel control? N  Managing your Medications? N  Managing your Finances? N  Housekeeping or managing your Housekeeping? N    Patient Care Team: Doreene Nest, NP as PCP - General (Internal Medicine) [facility], [facility]) Ross Marcus, MD as Consulting Physician (Orthopedic Surgery)  Indicate any recent Medical Services you may have received from other than [facility] providers in the past year (date may be approximate).     Assessment:   This is a routine wellness examination for Brookie.  Hearing/Vision screen Hearing Screening - Comments:: Denies hearing difficulties   Vision Screening - Comments:: Glasses - [facility]-  Exams are UTD  Dietary issues and exercise activities discussed:     Goals Addressed             This Visit's Progress    Patient Stated       Lose weight.       Depression Screen    04/30/2023   11:32 AM 11/01/2022    8:37 AM 02/22/2022    3:00 PM 05/01/2021    9:07 AM 07/31/2020    8:45 AM 07/31/2020    8:29 AM 07/26/2019    9:22 AM  PHQ 2/9 Scores  PHQ - 2 Score 0 0 0 0 0 0 0  PHQ- 9 Score    1   0    Fall Risk    04/25/2023   10:21 AM 01/30/2023   11:38 AM 11/01/2022    8:37 AM 05/29/2022    9:19 AM 02/22/2022    3:00 PM  Fall Risk   Falls in the past year? 1 1 0 0 1  Comment     fell in the bathroom  Number falls  in past yr: 0 0 0  1  Comment tripped on sidewalk      Injury with Fall? 1 1 0  1  Comment broke kneecap      Risk for fall due to : Impaired vision Impaired balance/gait No Fall Risks  Medication side effect  Follow up Falls evaluation completed;Education provided;Falls prevention discussed Falls evaluation completed Falls evaluation completed  Falls evaluation completed;Education provided;Falls prevention discussed    MEDICARE RISK AT HOME:   TIMED UP AND GO:  Was the test performed?  No    Cognitive Function:    07/26/2019    9:29 AM 06/28/2018   11:26 AM 06/23/2017    1:35 PM  MMSE - Mini Mental State Exam  Orientation to time 5 5 5   Orientation to Place 5 5 5   Registration 3 3 3   Attention/ Calculation 5 0 0  Recall 3 3 3   Language- name 2 objects  0 0  Language- repeat 1 1 1   Language- follow 3 step command  3 3  Language- read & follow direction  0 0  Write a sentence  0 0  Copy design  0 0  Total score  20 20        04/30/2023   11:36 AM 02/22/2022    3:01 PM  6CIT Screen  What Year? 0 points 0 points  What month? 0 points 0 points  What time? 0 points 0 points  Count back from 20 0 points 0 points  Months in reverse 0 points 0 points  Repeat phrase 0 points 0 points  Total Score 0 points 0 points    Immunizations Immunization History  Administered Date(s) Administered   PFIZER(Purple Top)SARS-COV-2 Vaccination 10/27/2019, 11/17/2019   Pneumococcal Conjugate-13 11/30/2014   Pneumococcal Polysaccharide-23 07/07/2013  Tdap 07/07/2013   Zoster, Live 01/01/2016    TDAP status: Due, Education has been provided regarding the importance of this vaccine. Advised may receive this vaccine at local pharmacy or Health Dept. Aware to provide a copy of the vaccination record if obtained from local pharmacy or Health Dept. Verbalized acceptance and understanding.  Flu Vaccine status: Declined, Education has been provided regarding the importance of this vaccine but  patient still declined. Advised may receive this vaccine at local pharmacy or Health Dept. Aware to provide a copy of the vaccination record if obtained from local pharmacy or Health Dept. Verbalized acceptance and understanding.  Pneumococcal vaccine status: Up to date  Covid-19 vaccine status: Declined, Education has been provided regarding the importance of this vaccine but patient still declined. Advised may receive this vaccine at local pharmacy or Health Dept.or vaccine clinic. Aware to provide a copy of the vaccination record if obtained from local pharmacy or Health Dept. Verbalized acceptance and understanding.  Qualifies for Shingles Vaccine? Yes   Zostavax completed Yes   Shingrix Completed?: No.    Education has been provided regarding the importance of this vaccine. Patient has been advised to call insurance company to determine out of pocket expense if they have not yet received this vaccine. Advised may also receive vaccine at local pharmacy or Health Dept. Verbalized acceptance and understanding.  Screening Tests Health Maintenance  Topic Date Due   Medicare Annual Wellness (AWV)  02/23/2023   Diabetic kidney evaluation - eGFR measurement  05/02/2023   INFLUENZA VACCINE  07/06/2049 (Originally 05/08/2023)   HEMOGLOBIN A1C  05/02/2023   DTaP/Tdap/Td (2 - Td or Tdap) 07/08/2023   Diabetic kidney evaluation - Urine ACR  11/02/2023   FOOT EXAM  11/02/2023   OPHTHALMOLOGY EXAM  04/14/2024   Pneumonia Vaccine 66+ Years old  Completed   DEXA SCAN  Completed   Hepatitis C Screening  Completed   HPV VACCINES  Aged Out   COVID-19 Vaccine  Discontinued   Fecal DNA (Cologuard)  Discontinued   Zoster Vaccines- Shingrix  Discontinued    Health Maintenance  Health Maintenance Due  Topic Date Due   Medicare Annual Wellness (AWV)  02/23/2023   Diabetic kidney evaluation - eGFR measurement  05/02/2023    Colorectal cancer screening: No longer required.   Mammogram status: Ordered  04/30/23. Pt provided with contact info and advised to call to schedule appt.   Bone Density status: Completed 06/19/22. Results reflect: Bone density results: NORMAL. Repeat every 5 years.  Lung Cancer Screening: (Low Dose CT Chest recommended if Age 76-80 years, 20 pack-year currently smoking OR have quit w/in 15years.) does not qualify.   Lung Cancer Screening Referral: n/a  Additional Screening:  Hepatitis C Screening: does qualify; Completed 06/23/17  Vision Screening: Recommended annual ophthalmology exams for early detection of glaucoma and other disorders of the eye. Is the patient up to date with their annual eye exam?  Yes  Who is the provider or what is the name of the office in which the patient attends annual eye exams? [facility] If pt is not established with a provider, would they like to be referred to a provider to establish care? Yes .   Dental Screening: Recommended annual dental exams for proper oral hygiene  Diabetic Foot Exam: Diabetic Foot Exam: Completed 11/01/22  Community Resource Referral / Chronic Care Management: CRR required this visit?  No   CCM required this visit?  No     Plan:  I have personally reviewed and noted the following in the patient's chart:   Medical and social history Use of alcohol, tobacco or illicit drugs  Current medications and supplements including opioid prescriptions. Patient is not currently taking opioid prescriptions. Functional ability and status Nutritional status Physical activity Advanced directives List of other physicians Hospitalizations, surgeries, and ER visits in previous 12 months Vitals Screenings to include cognitive, depression, and falls Referrals and appointments  In addition, I have reviewed and discussed with patient certain preventive protocols, quality metrics, and best practice recommendations. A written personalized care plan for preventive services as well as general preventive health  recommendations were provided to patient.     Maryan Puls, LPN   06/20/7828   After Visit Summary: (MyChart) Due to this being a telephonic visit, the after visit summary with patients personalized plan was offered to patient via MyChart   Nurse Notes: none

## 2023-05-02 ENCOUNTER — Ambulatory Visit (INDEPENDENT_AMBULATORY_CARE_PROVIDER_SITE_OTHER): Payer: Medicare HMO | Admitting: Primary Care

## 2023-05-02 ENCOUNTER — Encounter: Payer: Self-pay | Admitting: Primary Care

## 2023-05-02 VITALS — BP 132/80 | HR 75 | Temp 97.6°F | Ht 65.0 in | Wt 172.0 lb

## 2023-05-02 DIAGNOSIS — E1169 Type 2 diabetes mellitus with other specified complication: Secondary | ICD-10-CM

## 2023-05-02 DIAGNOSIS — Z23 Encounter for immunization: Secondary | ICD-10-CM | POA: Diagnosis not present

## 2023-05-02 DIAGNOSIS — Z0000 Encounter for general adult medical examination without abnormal findings: Secondary | ICD-10-CM | POA: Diagnosis not present

## 2023-05-02 DIAGNOSIS — I10 Essential (primary) hypertension: Secondary | ICD-10-CM

## 2023-05-02 DIAGNOSIS — E785 Hyperlipidemia, unspecified: Secondary | ICD-10-CM

## 2023-05-02 DIAGNOSIS — S82031S Displaced transverse fracture of right patella, sequela: Secondary | ICD-10-CM

## 2023-05-02 DIAGNOSIS — E1165 Type 2 diabetes mellitus with hyperglycemia: Secondary | ICD-10-CM

## 2023-05-02 DIAGNOSIS — S065XAA Traumatic subdural hemorrhage with loss of consciousness status unknown, initial encounter: Secondary | ICD-10-CM

## 2023-05-02 LAB — LIPID PANEL
Cholesterol: 143 mg/dL (ref 0–200)
HDL: 62.5 mg/dL (ref >39.00)
LDL Cholesterol: 58 mg/dL (ref 0–99)
NonHDL: 80.54
Total CHOL/HDL Ratio: 2
Triglycerides: 113 mg/dL (ref 0.0–149.0)
VLDL: 22.6 mg/dL (ref 0.0–40.0)

## 2023-05-02 LAB — COMPREHENSIVE METABOLIC PANEL
ALT: 16 U/L (ref 0–35)
AST: 16 U/L (ref 0–37)
Albumin: 4.3 g/dL (ref 3.5–5.2)
Alkaline Phosphatase: 104 U/L (ref 39–117)
BUN: 11 mg/dL (ref 6–23)
CO2: 31 mEq/L (ref 19–32)
Calcium: 9.3 mg/dL (ref 8.4–10.5)
Chloride: 102 mEq/L (ref 96–112)
Creatinine, Ser: 0.64 mg/dL (ref 0.40–1.20)
GFR: 85.58 mL/min (ref >60.00)
Glucose, Bld: 131 mg/dL — ABNORMAL HIGH (ref 70–99)
Potassium: 3.6 mEq/L (ref 3.5–5.1)
Sodium: 142 mEq/L (ref 135–145)
Total Bilirubin: 0.5 mg/dL (ref 0.2–1.2)
Total Protein: 6.7 g/dL (ref 6.0–8.3)

## 2023-05-02 LAB — HEMOGLOBIN A1C: Hgb A1c MFr Bld: 6.8 % — ABNORMAL HIGH (ref 4.6–6.5)

## 2023-05-02 NOTE — Assessment & Plan Note (Signed)
Resolved

## 2023-05-02 NOTE — Assessment & Plan Note (Signed)
Repeat A1C pending. Remain off treatment.  Follow up in 6 months.

## 2023-05-02 NOTE — Assessment & Plan Note (Signed)
Repeat lipid panel pending. Continue rosuvastatin 5 mg daily.

## 2023-05-02 NOTE — Patient Instructions (Signed)
Stop by the lab prior to leaving today. I will notify you of your results once received.   Please schedule a follow up visit for 6 months for a diabetes check.  It was a pleasure to see you today!   

## 2023-05-02 NOTE — Assessment & Plan Note (Signed)
Controlled.  Continue irbesartan-hydrochlorothiazide 150-12.5 mg daily. CMP pending.

## 2023-05-02 NOTE — Progress Notes (Signed)
Subjective:    Patient ID: Kim Holloway, female    DOB: 08-16-1946, 77 y.o.   MRN: [id number]  HPI  Kim Holloway is a very pleasant 77 y.o. female who presents today for complete physical and follow up of chronic conditions.  Immunizations: -Tetanus: Completed in 2014 -Shingles: Completed Zostavax -Pneumonia: Completed Prevnar 13 in 2016, Pneumovax 23 in 2014  Diet: Fair diet.  Exercise: No regular exercise. Recently began walking  Eye exam: Completes annually  Dental exam: Completes annually    Mammogram: Completed in September 2023 Bone Density Scan: Completed in September 2023  Colonoscopy: Completed Cologuard in 2021, negative. Declines further screening given age.   BP Readings from Last 3 Encounters:  05/02/23 132/80  01/30/23 134/86  01/23/23 (!) 157/78         Review of Systems  Constitutional:  Negative for unexpected weight change.  HENT:  Negative for rhinorrhea.   Respiratory:  Negative for cough and shortness of breath.   Cardiovascular:  Negative for chest pain.  Gastrointestinal:  Negative for constipation and diarrhea.  Genitourinary:  Negative for difficulty urinating.  Musculoskeletal:  Negative for arthralgias and myalgias.  Skin:  Negative for rash.  Allergic/Immunologic: Negative for environmental allergies.  Neurological:  Negative for dizziness, numbness and headaches.  Psychiatric/Behavioral:  The patient is not nervous/anxious.          Past Medical History:  Diagnosis Date   Abnormal breast biopsy 10/04/2013   right breast   Family history of adverse reaction to anesthesia 2016   brother had difficulty waking up after surgery   Finger mass, right 05/27/2017   History of colonoscopy 2008   Hypertension    Mammographic calcification 06/2017   Patellar fracture 01/30/2023   Proximal humerus fracture 03/15/2021   Left   Subdural hematoma (HCC) 01/30/2023    Social History   Socioeconomic History   Marital status: Married     Spouse name: Not on file   Number of children: Not on file   Years of education: Not on file   Highest education level: GED or equivalent  Occupational History   Not on file  Tobacco Use   Smoking status: Never   Smokeless tobacco: Never  Vaping Use   Vaping status: Never Used  Substance and Sexual Activity   Alcohol use: No   Drug use: No   Sexual activity: Not on file  Other Topics Concern   Not on file  Social History Narrative   Married.   Moved from [place]. Originally from [place].   Retired.      Social Determinants of Health   Financial Resource Strain: Low Risk  (04/30/2023)   Overall Financial Resource Strain (CARDIA)    Difficulty of Paying Living Expenses: Not hard at all  Food Insecurity: No Food Insecurity (04/30/2023)   Hunger Vital Sign    Worried About Running Out of Food in the Last Year: Never true    Ran Out of Food in the Last Year: Never true  Transportation Needs: No Transportation Needs (04/30/2023)   PRAPARE - Administrator, Civil Service (Medical): No    Lack of Transportation (Non-Medical): No  Physical Activity: Sufficiently Active (04/30/2023)   Exercise Vital Sign    Days of Exercise per Week: 6 days    Minutes of Exercise per Session: 60 min  Stress: No Stress Concern Present (04/30/2023)   [employer] of Occupational Health - Occupational Stress Questionnaire    Feeling of Stress : Not  at all  Social Connections: Moderately Integrated (04/30/2023)   Social Connection and Isolation Panel [NHANES]    Frequency of Communication with Friends and Family: More than three times a week    Frequency of Social Gatherings with Friends and Family: More than three times a week    Attends Religious Services: More than 4 times per year    Active Member of [employer] or Organizations: No    Attends Banker Meetings: Never    Marital Status: Married  Catering manager Violence: Not At Risk (04/30/2023)   Humiliation, Afraid, Rape,  and Kick questionnaire    Fear of Current or Ex-Partner: No    Emotionally Abused: No    Physically Abused: No    Sexually Abused: No    Past Surgical History:  Procedure Laterality Date   BREAST BIOPSY Right 2014   stereo bx benign calcs   BREAST BIOPSY Left ?   cyst   CATARACT EXTRACTION  01/19/2016, 02/19/2016   COLONOSCOPY  2008   CYST EXCISION Right 07/31/2017   Procedure: CYST REMOVAL RIGHT INDEX FINGER;  Surgeon: Kennedy Bucker, MD;  Location: [facility] ORS;  Service: Orthopedics;  Laterality: Right;   EYE SURGERY Left 12/2016   laser surgery    Family History  Problem Relation Age of Onset   Breast cancer Neg Hx     No Known Allergies  Current Outpatient Medications on File Prior to Visit  Medication Sig Dispense Refill   aspirin EC 81 MG tablet Take 81 mg by mouth daily.     Calcium Carb-Cholecalciferol (CALCIUM 600 + D PO) Take 2 tablets by mouth daily.     cholecalciferol (VITAMIN D) 1000 units tablet Take 1,000 Units by mouth daily.     irbesartan-hydrochlorothiazide (AVALIDE) 150-12.5 MG tablet TAKE 1 TABLET BY MOUTH ONCE  DAILY FOR HIGH BLOOD PRESSURE 90 tablet 2   rosuvastatin (CRESTOR) 5 MG tablet TAKE 1 TABLET BY MOUTH IN THE  EVENING FOR CHOLESTEROL 90 tablet 3   No current facility-administered medications on file prior to visit.    BP 132/80   Pulse 75   Temp 97.6 F (36.4 C) (Temporal)   Ht 5\' 5"  (1.651 m)   Wt 172 lb (78 kg)   SpO2 97%   BMI 28.62 kg/m  Objective:   Physical Exam HENT:     Right Ear: Tympanic membrane and ear canal normal.     Left Ear: Tympanic membrane and ear canal normal.     Nose: Nose normal.  Eyes:     Conjunctiva/sclera: Conjunctivae normal.     Pupils: Pupils are equal, round, and reactive to light.  Neck:     Thyroid: No thyromegaly.  Cardiovascular:     Rate and Rhythm: Normal rate and regular rhythm.     Heart sounds: No murmur heard. Pulmonary:     Effort: Pulmonary effort is normal.     Breath sounds: Normal  breath sounds. No rales.  Abdominal:     General: Bowel sounds are normal.     Palpations: Abdomen is soft.     Tenderness: There is no abdominal tenderness.  Musculoskeletal:        General: Normal range of motion.     Cervical back: Neck supple.  Lymphadenopathy:     Cervical: No cervical adenopathy.  Skin:    General: Skin is warm and dry.     Findings: No rash.  Neurological:     Mental Status: She is alert and oriented to person,  place, and time.     Cranial Nerves: No cranial nerve deficit.     Deep Tendon Reflexes: Reflexes are normal and symmetric.  Psychiatric:        Mood and Affect: Mood normal.           Assessment & Plan:  Essential hypertension Assessment & Plan: Controlled.  Continue irbesartan-hydrochlorothiazide 150-12.5 mg daily. CMP pending.  Orders: -     Comprehensive metabolic panel  Hyperlipidemia associated with type 2 diabetes mellitus (HCC) Assessment & Plan: Repeat lipid panel pending.  Continue rosuvastatin 5 mg daily.  Orders: -     Lipid panel  Type 2 diabetes mellitus with hyperglycemia, without long-term current use of insulin (HCC) Assessment & Plan: Repeat A1C pending.  Remain off treatment.  Follow up in 6 months.  Orders: -     Hemoglobin A1c  Subdural hematoma (HCC) Assessment & Plan: Resolved.    Closed displaced transverse fracture of right patella, sequela Assessment & Plan: Resolved.     Preventative health care Assessment & Plan: Prevnar 20 provided today. Mammogram up-to-date, she would like to proceed with annual screenings.  Due in September 2024. Bone density scan up-to-date. Colon cancer screening due, declines further screening given age  Discussed the importance of a healthy diet and regular exercise in order for weight loss, and to reduce the risk of further co-morbidity.  Exam stable. Labs pending.  Follow up in 1 year for repeat physical.          Doreene Nest,  NP

## 2023-05-02 NOTE — Assessment & Plan Note (Signed)
Prevnar 20 provided today. Mammogram up-to-date, she would like to proceed with annual screenings.  Due in September 2024. Bone density scan up-to-date. Colon cancer screening due, declines further screening given age  Discussed the importance of a healthy diet and regular exercise in order for weight loss, and to reduce the risk of further co-morbidity.  Exam stable. Labs pending.  Follow up in 1 year for repeat physical.

## 2023-06-19 ENCOUNTER — Telehealth: Payer: Self-pay | Admitting: Primary Care

## 2023-06-19 DIAGNOSIS — I10 Essential (primary) hypertension: Secondary | ICD-10-CM

## 2023-06-19 DIAGNOSIS — E785 Hyperlipidemia, unspecified: Secondary | ICD-10-CM

## 2023-06-19 MED ORDER — ROSUVASTATIN CALCIUM 5 MG PO TABS
5.0000 mg | ORAL_TABLET | Freq: Every day | ORAL | 2 refills | Status: DC
Start: 2023-06-19 — End: 2024-05-04

## 2023-06-19 MED ORDER — IRBESARTAN-HYDROCHLOROTHIAZIDE 150-12.5 MG PO TABS
ORAL_TABLET | ORAL | 2 refills | Status: DC
Start: 2023-06-19 — End: 2024-05-04

## 2023-06-19 NOTE — Telephone Encounter (Signed)
Refills sent to pharmacy. 

## 2023-06-19 NOTE — Telephone Encounter (Signed)
Prescription Request  06/19/2023  LOV: 05/02/2023  What is the name of the medication or equipment? irbesartan-hydrochlorothiazide (AVALIDE) 150-12.5 MG tablet  rosuvastatin (CRESTOR) 5 MG tablet  Have you contacted your pharmacy to request a refill? No   Which pharmacy would you like this sent to?  [facility] - [place], [place] - [address] [place] [address] Phone: [phone] Fax: [phone]    Patient notified that their request is being sent to the clinical staff for review and that they should receive a response within 2 business days.   Please advise at Mobile [phone] (mobile)

## 2023-06-23 ENCOUNTER — Ambulatory Visit
Admission: RE | Admit: 2023-06-23 | Discharge: 2023-06-23 | Disposition: A | Discharge status: Home / Self Care | Payer: Medicare HMO | Source: Ambulatory Visit | Attending: Primary Care | Admitting: Primary Care

## 2023-06-23 DIAGNOSIS — Z1231 Encounter for screening mammogram for malignant neoplasm of breast: Secondary | ICD-10-CM | POA: Insufficient documentation

## 2023-09-16 ENCOUNTER — Other Ambulatory Visit: Payer: Self-pay

## 2023-09-16 ENCOUNTER — Emergency Department
Admission: EM | Admit: 2023-09-16 | Discharge: 2023-09-16 | Disposition: A | Discharge status: Home / Self Care | Payer: Medicare HMO | Attending: Emergency Medicine | Admitting: Emergency Medicine

## 2023-09-16 ENCOUNTER — Encounter: Payer: Self-pay | Admitting: Emergency Medicine

## 2023-09-16 ENCOUNTER — Emergency Department: Payer: Medicare HMO

## 2023-09-16 DIAGNOSIS — Y92009 Unspecified place in unspecified non-institutional (private) residence as the place of occurrence of the external cause: Secondary | ICD-10-CM | POA: Diagnosis not present

## 2023-09-16 DIAGNOSIS — S42431A Displaced fracture (avulsion) of lateral epicondyle of right humerus, initial encounter for closed fracture: Secondary | ICD-10-CM | POA: Diagnosis not present

## 2023-09-16 DIAGNOSIS — W19XXXA Unspecified fall, initial encounter: Secondary | ICD-10-CM

## 2023-09-16 DIAGNOSIS — W010XXA Fall on same level from slipping, tripping and stumbling without subsequent striking against object, initial encounter: Secondary | ICD-10-CM | POA: Insufficient documentation

## 2023-09-16 DIAGNOSIS — S40911A Unspecified superficial injury of right shoulder, initial encounter: Secondary | ICD-10-CM | POA: Diagnosis present

## 2023-09-16 MED ORDER — IBUPROFEN 800 MG PO TABS
800.0000 mg | ORAL_TABLET | Freq: Once | ORAL | Status: AC
  Administered 2023-09-16: 800 mg via ORAL
  Filled 2023-09-16: qty 1

## 2023-09-16 NOTE — ED Provider Notes (Signed)
[facility] Emergency Department Provider Note     Event Date/Time   First MD Initiated Contact with Patient 09/16/23 1547     (approximate)   History   Fall   HPI  Kim Holloway is a 77 y.o. female presents to the ED for evaluation of left elbow pain following a fall at home.  Patient reports she tripped over a rug and landed on both elbows.  Immediate pain and swelling to the left elbow.  She denies hitting her head or LOC.  She has taken 2 Tylenol's prior to ED. denies numbness and tingling.     Physical Exam   Triage Vital Signs: ED Triage Vitals  Encounter Vitals Group     BP 09/16/23 1453 (!) 151/114     Systolic BP Percentile --      Diastolic BP Percentile --      Pulse Rate 09/16/23 1453 95     Resp 09/16/23 1452 18     Temp 09/16/23 1452 97.8 F (36.6 C)     Temp Source 09/16/23 1452 Oral     SpO2 09/16/23 1453 93 %     Weight 09/16/23 1452 171 lb 15.3 oz (78 kg)     Height 09/16/23 1452 5\' 5"  (1.651 m)     Head Circumference --      Peak Flow --      Pain Score 09/16/23 1452 10     Pain Loc --      Pain Education --      Exclude from Growth Chart --     Most recent vital signs: Vitals:   09/16/23 1452 09/16/23 1453  BP:  (!) 151/114  Pulse:  95  Resp: 18   Temp: 97.8 F (36.6 C)   SpO2:  93%    General Awake, no distress.  HEENT NCAT. PERRL. EOMI. No rhinorrhea. Mucous membranes are moist.  CV:  Good peripheral perfusion.  RESP:  Normal effort.  ABD:  No distention.  Other:  Left elbow reveals moderate edema.  Flexion and extension limited due to pain.  Neurovascular status intact all throughout.  Capillary refills brisk and normal.  No ecchymosis or open skin.   ED Results / Procedures / Treatments   Labs (all labs ordered are listed, but only abnormal results are displayed) Labs Reviewed - No data to display  RADIOLOGY  I personally viewed and evaluated these images as part of my medical decision making, as  well as reviewing the written report by the radiologist.  ED Provider Interpretation: Small fracture of the lateral epicondyle.  DG Elbow Complete Left  Result Date: 09/16/2023 CLINICAL DATA:  Fall.  Left elbow pain.  Limited range of movement. EXAM: LEFT ELBOW - COMPLETE 3+ VIEW COMPARISON:  None Available. FINDINGS: There is small to moderate elbow joint effusion and minimally displaced fracture of the lateral epicondyle of the humerus. No other acute fracture or dislocation. No aggressive osseous lesion. Mild elbow joint arthritis also noted. No radiopaque foreign bodies. Soft tissues are within normal limits. IMPRESSION: *Minimal displaced fracture of the lateral epicondyle of the humerus and associated small-to-moderate elbow joint effusion. Electronically Signed   By: Jules Schick M.D.   On: 09/16/2023 16:40    PROCEDURES:  Critical Care performed: No  Procedures   MEDICATIONS ORDERED IN ED: Medications  ibuprofen (ADVIL) tablet 800 mg (has no administration in time range)     IMPRESSION / MDM / ASSESSMENT AND PLAN / ED COURSE  I reviewed the triage vital signs and the nursing notes.                               77 y.o. female presents to the emergency department for evaluation and treatment of left elbow injury. See HPI for further details.   Differential diagnosis includes, but is not limited to fracture, dislocation, neurovascular injury  Patient's presentation is most consistent with acute complicated illness / injury requiring diagnostic workup.  Patient is alert and oriented.  She is hemodynamically stable.  Physical exam findings are as stated above.  X-ray reveals a minimally displaced fracture of the lateral epicondyle of the left humerus with a small to moderate joint effusion.  Placed in a long-arm splint and provided with a sling.  Encouraged to remain in splint until can follow-up with orthopedics the following day.  RICE therapy education provided.  Patient is  in stable and satisfactory condition for discharge home.  ED precautions discussed.  FINAL CLINICAL IMPRESSION(S) / ED DIAGNOSES   Final diagnoses:  Fall, initial encounter  Closed displaced fracture of lateral epicondyle of right humerus, unspecified fracture morphology, initial encounter    Rx / DC Orders   ED Discharge Orders     None       Note:  This document was prepared using Dragon voice recognition software and may include unintentional dictation errors.     Romeo Apple, Abagayle Klutts A, PA-C 09/16/23 1716    Concha Se, MD 09/18/23 [phone]

## 2023-09-16 NOTE — Discharge Instructions (Signed)
Your evaluated in the ED for a fall and elbow pain.  Your x-ray reveals a mildly displaced fracture of the lateral epicondyle of your upper arm bone.  You have been placed in a splint and provided with a sling.  Please remain in this until you can make an appointment with orthopedic.  Please call Dr. Binnie Rail office tomorrow to schedule appointment for further evaluation.  Take Tylenol and ibuprofen for pain as needed.

## 2023-09-16 NOTE — ED Provider Triage Note (Signed)
Emergency Medicine Provider Triage Evaluation Note  Kim Holloway , a 77 y.o. female  was evaluated in triage.  Pt complains of tripping and falling, landed on her elbow, did not hit her head, no LOC.  Review of Systems  Positive: Elbow pain Negative:   Physical Exam  There were no vitals taken for this visit. Gen:   Awake, no distress   Resp:  Normal effort  MSK:   Moves extremities without difficulty  Other:    Medical Decision Making  Medically screening exam initiated at 2:52 PM.  Appropriate orders placed.  Kim Holloway was informed that the remainder of the evaluation will be completed by another provider, this initial triage assessment does not replace that evaluation, and the importance of remaining in the ED until their evaluation is complete.     Cameron Ali, PA-C 09/16/23 1453

## 2023-09-16 NOTE — ED Triage Notes (Signed)
Patient to ED via P[facility] for a fall. Patient states she tripped and fell landing on left elbow. Denies hitting her head.

## 2023-09-26 ENCOUNTER — Other Ambulatory Visit: Payer: Self-pay | Admitting: Student

## 2023-09-26 DIAGNOSIS — S42432A Displaced fracture (avulsion) of lateral epicondyle of left humerus, initial encounter for closed fracture: Secondary | ICD-10-CM

## 2023-10-02 ENCOUNTER — Ambulatory Visit
Admission: RE | Admit: 2023-10-02 | Discharge: 2023-10-02 | Disposition: A | Discharge status: Home / Self Care | Payer: Medicare HMO | Source: Ambulatory Visit | Attending: Student | Admitting: Student

## 2023-10-02 DIAGNOSIS — S42432A Displaced fracture (avulsion) of lateral epicondyle of left humerus, initial encounter for closed fracture: Secondary | ICD-10-CM

## 2023-11-04 ENCOUNTER — Ambulatory Visit (INDEPENDENT_AMBULATORY_CARE_PROVIDER_SITE_OTHER): Payer: Medicare HMO | Admitting: Primary Care

## 2023-11-04 ENCOUNTER — Encounter: Payer: Self-pay | Admitting: Primary Care

## 2023-11-04 VITALS — BP 132/80 | HR 73 | Temp 98.2°F | Ht 65.0 in | Wt 178.0 lb

## 2023-11-04 DIAGNOSIS — E1165 Type 2 diabetes mellitus with hyperglycemia: Secondary | ICD-10-CM | POA: Diagnosis not present

## 2023-11-04 LAB — POCT GLYCOSYLATED HEMOGLOBIN (HGB A1C): Hemoglobin A1C: 6.4 % — AB (ref 4.0–5.6)

## 2023-11-04 LAB — MICROALBUMIN / CREATININE URINE RATIO
Creatinine,U: 28.7 mg/dL
Microalb Creat Ratio: 2.4 mg/g (ref 0.0–30.0)
Microalb, Ur: <0.7 mg/dL (ref 0.0–1.9)

## 2023-11-04 MED ORDER — BLOOD GLUCOSE MONITORING SUPPL DEVI: 1.0000 | Freq: Three times a day (TID) | 0 refills | Status: DC

## 2023-11-04 MED ORDER — BLOOD GLUCOSE TEST VI STRP: 1.0000 | ORAL_STRIP | Freq: Three times a day (TID) | 5 refills | Status: DC

## 2023-11-04 MED ORDER — LANCETS MISC. MISC: 1.0000 | Freq: Three times a day (TID) | 5 refills | Status: DC

## 2023-11-04 MED ORDER — LANCET DEVICE MISC: 1.0000 | Freq: Three times a day (TID) | 0 refills | Status: AC

## 2023-11-04 NOTE — Patient Instructions (Signed)
Stop by the lab prior to leaving today. I will notify you of your results once received.   Start checking your blood sugar levels.  Appropriate times to check your blood sugar levels are:  -Before any meal (breakfast, lunch, dinner) -Two hours after any meal (breakfast, lunch, dinner) -Bedtime  Record your readings and notify me if you continue to consistently run at or above 150.   Please schedule a physical to meet with me in 6 months.   It was a pleasure to see you today!

## 2023-11-04 NOTE — Progress Notes (Signed)
Subjective:    Patient ID: Kim Holloway, female    DOB: Nov 29, 1945, 78 y.o.   MRN: [id number]  HPI  Kelse Ploch is a very pleasant 78 y.o. female with a history of hypertension, type 2 diabetes, hyperlipidemia who presents today for follow-up of diabetes.  Current medications include: None  She is checking her blood glucose 0 times daily.  Last A1C: 6.8 in July 2024, 6.4 today Last Eye Exam: UTD Last Foot Exam: Due Pneumonia Vaccination: 2024 Urine Microalbumin: Due Statin: rosuvastatin   Dietary changes since last visit: Increased sweets and carbs during Christmas.    Exercise: No exercise due to colder weather.   BP Readings from Last 3 Encounters:  11/04/23 132/80  09/16/23 (!) 153/78  05/02/23 132/80     Review of Systems  Eyes:  Negative for visual disturbance.  Respiratory:  Negative for shortness of breath.   Cardiovascular:  Negative for chest pain.  Neurological:  Negative for dizziness and numbness.         Past Medical History:  Diagnosis Date   Abnormal breast biopsy 10/04/2013   right breast   Family history of adverse reaction to anesthesia 2016   brother had difficulty waking up after surgery   Finger mass, right 05/27/2017   History of colonoscopy 2008   Hypertension    Mammographic calcification 06/2017   Patellar fracture 01/30/2023   Proximal humerus fracture 03/15/2021   Left   Subdural hematoma (HCC) 01/30/2023    Social History   Socioeconomic History   Marital status: Married    Spouse name: Not on file   Number of children: Not on file   Years of education: Not on file   Highest education level: GED or equivalent  Occupational History   Not on file  Tobacco Use   Smoking status: Never   Smokeless tobacco: Never  Vaping Use   Vaping status: Never Used  Substance and Sexual Activity   Alcohol use: No   Drug use: No   Sexual activity: Not on file  Other Topics Concern   Not on file  Social History Narrative    Married.   Moved from [place]. Originally from [place].   Retired.      Social Drivers of Corporate investment banker Strain: Low Risk  (10/28/2023)   Overall Financial Resource Strain (CARDIA)    Difficulty of Paying Living Expenses: Not very hard  Food Insecurity: No Food Insecurity (10/28/2023)   Hunger Vital Sign    Worried About Running Out of Food in the Last Year: Never true    Ran Out of Food in the Last Year: Never true  Transportation Needs: No Transportation Needs (10/28/2023)   PRAPARE - Administrator, Civil Service (Medical): No    Lack of Transportation (Non-Medical): No  Physical Activity: Sufficiently Active (10/28/2023)   Exercise Vital Sign    Days of Exercise per Week: 6 days    Minutes of Exercise per Session: 40 min  Stress: No Stress Concern Present (10/28/2023)   [employer] of Occupational Health - Occupational Stress Questionnaire    Feeling of Stress : Only a little  Social Connections: Socially Integrated (10/28/2023)   Social Connection and Isolation Panel [NHANES]    Frequency of Communication with Friends and Family: More than three times a week    Frequency of Social Gatherings with Friends and Family: Once a week    Attends Religious Services: 1 to 4 times per year  Active Member of Clubs or Organizations: Yes    Attends Banker Meetings: More than 4 times per year    Marital Status: Married  Catering manager Violence: Not At Risk (04/30/2023)   Humiliation, Afraid, Rape, and Kick questionnaire    Fear of Current or Ex-Partner: No    Emotionally Abused: No    Physically Abused: No    Sexually Abused: No    Past Surgical History:  Procedure Laterality Date   BREAST BIOPSY Right 2014   stereo bx benign calcs   BREAST BIOPSY Left ?   cyst   CATARACT EXTRACTION  01/19/2016, 02/19/2016   COLONOSCOPY  2008   CYST EXCISION Right 07/31/2017   Procedure: CYST REMOVAL RIGHT INDEX FINGER;  Surgeon: Kennedy Bucker, MD;  Location:  [facility] ORS;  Service: Orthopedics;  Laterality: Right;   EYE SURGERY Left 12/2016   laser surgery    Family History  Problem Relation Age of Onset   Breast cancer Neg Hx     No Known Allergies  Current Outpatient Medications on File Prior to Visit  Medication Sig Dispense Refill   aspirin EC 81 MG tablet Take 81 mg by mouth daily.     Calcium Carb-Cholecalciferol (CALCIUM 600 + D PO) Take 2 tablets by mouth daily.     cholecalciferol (VITAMIN D) 1000 units tablet Take 1,000 Units by mouth daily.     irbesartan-hydrochlorothiazide (AVALIDE) 150-12.5 MG tablet TAKE 1 TABLET BY MOUTH ONCE DAILY FOR HIGH BLOOD  PRESSURE 90 tablet 2   rosuvastatin (CRESTOR) 5 MG tablet Take 1 tablet (5 mg total) by mouth daily. for cholesterol. 90 tablet 2   No current facility-administered medications on file prior to visit.    BP 132/80   Pulse 73   Temp 98.2 F (36.8 C) (Temporal)   Ht 5\' 5"  (1.651 m)   Wt 178 lb (80.7 kg)   SpO2 98%   BMI 29.62 kg/m  Objective:   Physical Exam Cardiovascular:     Rate and Rhythm: Normal rate and regular rhythm.  Pulmonary:     Effort: Pulmonary effort is normal.     Breath sounds: Normal breath sounds.  Musculoskeletal:     Cervical back: Neck supple.  Skin:    General: Skin is warm and dry.  Neurological:     Mental Status: She is alert and oriented to person, place, and time.  Psychiatric:        Mood and Affect: Mood normal.           Assessment & Plan:  Type 2 diabetes mellitus with hyperglycemia, without long-term current use of insulin (HCC) Assessment & Plan: Improved with A1C today of 6.4 today!  Reman off treatment.   Foot exam today. Urine microalbumin pending.   Rx for glucometer kit sent to pharmacy.  Discussed instructions for use and correct times to check glucose levels.  Follow-up in 6 months.  Orders: -     POCT glycosylated hemoglobin (Hb A1C) -     Microalbumin / creatinine urine ratio -     Blood Glucose  Monitoring Suppl; 1 each by Does not apply route in the morning, at noon, and at bedtime. May substitute to any manufacturer covered by patient's insurance.  Dispense: 1 each; Refill: 0 -     Blood Glucose Test; 1 each by In Vitro route in the morning, at noon, and at bedtime. May substitute to any manufacturer covered by patient's insurance.  Dispense: 100 strip; Refill: 5 -  Lancet Device; 1 each by Does not apply route in the morning, at noon, and at bedtime. May substitute to any manufacturer covered by patient's insurance.  Dispense: 1 each; Refill: 0 -     Lancets Misc.; 1 each by Does not apply route in the morning, at noon, and at bedtime. May substitute to any manufacturer covered by patient's insurance.  Dispense: 100 each; Refill: 5        Doreene Nest, NP

## 2023-11-04 NOTE — Assessment & Plan Note (Signed)
Improved with A1C today of 6.4 today!  Reman off treatment.   Foot exam today. Urine microalbumin pending.   Rx for glucometer kit sent to pharmacy.  Discussed instructions for use and correct times to check glucose levels.  Follow-up in 6 months.

## 2024-01-27 ENCOUNTER — Other Ambulatory Visit: Payer: Self-pay | Admitting: Primary Care

## 2024-01-27 DIAGNOSIS — I10 Essential (primary) hypertension: Secondary | ICD-10-CM

## 2024-01-27 DIAGNOSIS — E785 Hyperlipidemia, unspecified: Secondary | ICD-10-CM

## 2024-05-04 ENCOUNTER — Ambulatory Visit (INDEPENDENT_AMBULATORY_CARE_PROVIDER_SITE_OTHER): Payer: Medicare HMO

## 2024-05-04 ENCOUNTER — Encounter: Payer: Self-pay | Admitting: Primary Care

## 2024-05-04 ENCOUNTER — Ambulatory Visit (INDEPENDENT_AMBULATORY_CARE_PROVIDER_SITE_OTHER): Payer: Medicare HMO | Admitting: Primary Care

## 2024-05-04 VITALS — Ht 65.0 in | Wt 166.0 lb

## 2024-05-04 VITALS — BP 124/62 | HR 89 | Temp 97.6°F | Ht 65.0 in | Wt 166.0 lb

## 2024-05-04 DIAGNOSIS — Z1231 Encounter for screening mammogram for malignant neoplasm of breast: Secondary | ICD-10-CM

## 2024-05-04 DIAGNOSIS — E785 Hyperlipidemia, unspecified: Secondary | ICD-10-CM | POA: Diagnosis not present

## 2024-05-04 DIAGNOSIS — E1169 Type 2 diabetes mellitus with other specified complication: Secondary | ICD-10-CM | POA: Diagnosis not present

## 2024-05-04 DIAGNOSIS — I10 Essential (primary) hypertension: Secondary | ICD-10-CM | POA: Diagnosis not present

## 2024-05-04 DIAGNOSIS — E1165 Type 2 diabetes mellitus with hyperglycemia: Secondary | ICD-10-CM

## 2024-05-04 DIAGNOSIS — Z0000 Encounter for general adult medical examination without abnormal findings: Secondary | ICD-10-CM

## 2024-05-04 DIAGNOSIS — E2839 Other primary ovarian failure: Secondary | ICD-10-CM

## 2024-05-04 LAB — CBC
HCT: 39.5 % (ref 36.0–46.0)
Hemoglobin: 13.4 g/dL (ref 12.0–15.0)
MCHC: 34 g/dL (ref 30.0–36.0)
MCV: 92 fl (ref 78.0–100.0)
Platelets: 246 K/uL (ref 150.0–400.0)
RBC: 4.29 Mil/uL (ref 3.87–5.11)
RDW: 12.9 % (ref 11.5–15.5)
WBC: 9.4 K/uL (ref 4.0–10.5)

## 2024-05-04 LAB — COMPREHENSIVE METABOLIC PANEL WITH GFR
ALT: 11 U/L (ref 0–35)
AST: 12 U/L (ref 0–37)
Albumin: 4.4 g/dL (ref 3.5–5.2)
Alkaline Phosphatase: 97 U/L (ref 39–117)
BUN: 11 mg/dL (ref 6–23)
CO2: 31 meq/L (ref 19–32)
Calcium: 8.9 mg/dL (ref 8.4–10.5)
Chloride: 101 meq/L (ref 96–112)
Creatinine, Ser: 0.67 mg/dL (ref 0.40–1.20)
GFR: 84.04 mL/min (ref >60.00)
Glucose, Bld: 124 mg/dL — ABNORMAL HIGH (ref 70–99)
Potassium: 3.8 meq/L (ref 3.5–5.1)
Sodium: 141 meq/L (ref 135–145)
Total Bilirubin: 0.5 mg/dL (ref 0.2–1.2)
Total Protein: 6.6 g/dL (ref 6.0–8.3)

## 2024-05-04 LAB — LIPID PANEL
Cholesterol: 133 mg/dL (ref 0–200)
HDL: 54.8 mg/dL (ref >39.00)
LDL Cholesterol: 65 mg/dL (ref 0–99)
NonHDL: 78.69
Total CHOL/HDL Ratio: 2
Triglycerides: 70 mg/dL (ref 0.0–149.0)
VLDL: 14 mg/dL (ref 0.0–40.0)

## 2024-05-04 LAB — MICROALBUMIN / CREATININE URINE RATIO
Creatinine,U: 87.9 mg/dL
Microalb Creat Ratio: UNDETERMINED mg/g (ref 0.0–30.0)
Microalb, Ur: <0.7 mg/dL

## 2024-05-04 LAB — HEMOGLOBIN A1C: Hgb A1c MFr Bld: 6.8 % — ABNORMAL HIGH (ref 4.6–6.5)

## 2024-05-04 MED ORDER — ROSUVASTATIN CALCIUM 5 MG PO TABS: 5.0000 mg | ORAL_TABLET | Freq: Every day | ORAL | 3 refills | Status: AC

## 2024-05-04 MED ORDER — IRBESARTAN-HYDROCHLOROTHIAZIDE 150-12.5 MG PO TABS: ORAL_TABLET | ORAL | 3 refills | Status: AC

## 2024-05-04 NOTE — Assessment & Plan Note (Signed)
 Immunizations UTD. Mammogram and bone density scan due in September, orders placed. Colonoscopy UTD, no further screening given age  Discussed the importance of a healthy diet and regular exercise in order for weight loss, and to reduce the risk of further co-morbidity.  Exam stable. Labs pending.  Follow up in 1 year for repeat physical.

## 2024-05-04 NOTE — Progress Notes (Signed)
 Subjective:   Kim Holloway is a 78 y.o. who presents for a Medicare Wellness preventive visit.  As a reminder, Annual Wellness Visits don't include a physical exam, and some assessments may be limited, especially if this visit is performed virtually. We may recommend an in-person follow-up visit with your provider if needed.  Visit Complete: Virtual I connected with  Kim Holloway on 05/03/24 by a audio enabled telemedicine application and verified that I am speaking with the correct person using two identifiers.  Patient Location: Home  Provider Location: Office/Clinic  I discussed the limitations of evaluation and management by telemedicine. The patient expressed understanding and agreed to proceed.  Vital Signs: Because this visit was a virtual/telehealth visit, some criteria may be missing or patient reported. Any vitals not documented were not able to be obtained and vitals that have been documented are patient reported.  VideoError- Librarian, academic were attempted between this provider and patient, however failed, due to patient having technical difficulties OR patient did not have access to video capability.  We continued and completed visit with audio only.   Persons Participating in Visit: Patient.  AWV Questionnaire: Yes: Patient Medicare AWV questionnaire was completed by the patient on 05/03/24; I have confirmed that all information answered by patient is correct and no changes since this date.       Objective:    Today's Vitals   05/04/24 0930  Weight: 166 lb (75.3 kg)  Height: 5' 5 (1.651 m)   Body mass index is 27.62 kg/m.     05/04/2024    9:42 AM 09/16/2023    2:53 PM 04/30/2023   11:33 AM 02/22/2022    2:59 PM 07/26/2019    9:21 AM 06/26/2018   10:50 AM 07/31/2017    6:06 AM  Advanced Directives  Does Patient Have a Medical Advance Directive? Yes No No No No No  No   Type of Estate agent of [place];Living  will        Copy of Healthcare Power of Attorney in Chart? No - copy requested        Would patient like information on creating a medical advance directive?   No - Patient declined  No - Patient declined No - Patient declined  No - Patient declined      Data saved with a previous flowsheet row definition    Current Medications (verified) Outpatient Encounter Medications as of 05/04/2024  Medication Sig   aspirin EC 81 MG tablet Take 81 mg by mouth daily.   Blood Glucose Monitoring Suppl DEVI 1 each by Does not apply route in the morning, at noon, and at bedtime. May substitute to any manufacturer covered by patient's insurance.   Calcium  Carb-Cholecalciferol (CALCIUM  600 + D PO) Take 2 tablets by mouth daily.   cholecalciferol (VITAMIN D) 1000 units tablet Take 1,000 Units by mouth daily.   Glucose Blood (BLOOD GLUCOSE TEST STRIPS) STRP 1 each by In Vitro route in the morning, at noon, and at bedtime. May substitute to any manufacturer covered by patient's insurance.   irbesartan -hydrochlorothiazide  (AVALIDE) 150-12.5 MG tablet TAKE 1 TABLET BY MOUTH ONCE DAILY FOR HIGH BLOOD  PRESSURE   Lancets Misc. MISC 1 each by Does not apply route in the morning, at noon, and at bedtime. May substitute to any manufacturer covered by patient's insurance.   rosuvastatin  (CRESTOR ) 5 MG tablet Take 1 tablet (5 mg total) by mouth daily. for cholesterol.   No facility-administered encounter  medications on file as of 05/04/2024.    Allergies (verified) Patient has no known allergies.   History: Past Medical History:  Diagnosis Date   Abnormal breast biopsy 10/04/2013   right breast   Arthritis 12/24   Told by orthopedic DR.   Cataract    cataract surgery both eyes   Family history of adverse reaction to anesthesia 2016   brother had difficulty waking up after surgery   Finger mass, right 05/27/2017   History of colonoscopy 2008   Hypertension    Mammographic calcification 06/2017   Patellar  fracture 01/30/2023   Proximal humerus fracture 03/15/2021   Left   Scalp cyst 05/01/2022   Subdural hematoma (HCC) 01/30/2023   Past Surgical History:  Procedure Laterality Date   BREAST BIOPSY Right 2014   stereo bx benign calcs   BREAST BIOPSY Left ?   cyst   CATARACT EXTRACTION  01/19/2016, 02/19/2016   COLONOSCOPY  2008   CYST EXCISION Right 07/31/2017   Procedure: CYST REMOVAL RIGHT INDEX FINGER;  Surgeon: Kathlynn Sharper, MD;  Location: [facility] ORS;  Service: Orthopedics;  Laterality: Right;   EYE SURGERY Left 12/2016   laser surgery   Family History  Problem Relation Age of Onset   Heart disease Mother    Cancer Brother    Hypertension Brother    Diabetes Brother    Breast cancer Neg Hx    Social History   Socioeconomic History   Marital status: Married    Spouse name: Not on file   Number of children: Not on file   Years of education: Not on file   Highest education level: GED or equivalent  Occupational History   Not on file  Tobacco Use   Smoking status: Never   Smokeless tobacco: Never  Vaping Use   Vaping status: Never Used  Substance and Sexual Activity   Alcohol use: No   Drug use: No   Sexual activity: Not on file  Other Topics Concern   Not on file  Social History Narrative   Married.   Moved from [place] . Originally from [place].   Retired.      Social Drivers of Corporate investment banker Strain: Low Risk  (05/04/2024)   Overall Financial Resource Strain (CARDIA)    Difficulty of Paying Living Expenses: Not hard at all  Food Insecurity: No Food Insecurity (05/04/2024)   Hunger Vital Sign    Worried About Running Out of Food in the Last Year: Never true    Ran Out of Food in the Last Year: Never true  Transportation Needs: No Transportation Needs (05/04/2024)   PRAPARE - Administrator, Civil Service (Medical): No    Lack of Transportation (Non-Medical): No  Physical Activity: Sufficiently Active (05/04/2024)   Exercise Vital Sign     Days of Exercise per Week: 6 days    Minutes of Exercise per Session: 60 min  Stress: No Stress Concern Present (05/04/2024)   [employer] of Occupational Health - Occupational Stress Questionnaire    Feeling of Stress: Not at all  Social Connections: Socially Integrated (05/04/2024)   Social Connection and Isolation Panel    Frequency of Communication with Friends and Family: More than three times a week    Frequency of Social Gatherings with Friends and Family: More than three times a week    Attends Religious Services: More than 4 times per year    Active Member of Clubs or Organizations: Yes    Attends  Club or Organization Meetings: 1 to 4 times per year    Marital Status: Married    Tobacco Counseling Counseling given: Not Answered    Clinical Intake:  Pre-visit preparation completed: No  Pain : No/denies pain     BMI - recorded: 27.62 Nutritional Status: BMI 25 -29 Overweight Nutritional Risks: None Diabetes: Yes CBG done?: No Did pt. bring in CBG monitor from home?: No  Lab Results  Component Value Date   HGBA1C 6.4 (A) 11/04/2023   HGBA1C 6.8 (H) 05/02/2023   HGBA1C 6.4 (A) 11/01/2022     How often do you need to have someone help you when you read instructions, pamphlets, or other written materials from your doctor or pharmacy?: 1 - Never  Interpreter Needed?: No  Comments: lives with husband Information entered by :: B.Ivyrose Hashman,LPN   Activities of Daily Living     05/03/2024   10:04 PM  In your present state of health, do you have any difficulty performing the following activities:  Hearing? 0  Vision? 0  Difficulty concentrating or making decisions? 0  Walking or climbing stairs? 0  Dressing or bathing? 0  Doing errands, shopping? 0  Preparing Food and eating ? N  Using the Toilet? N  In the past six months, have you accidently leaked urine? N  Do you have problems with loss of bowel control? N  Managing your Medications? N  Managing  your Finances? N  Housekeeping or managing your Housekeeping? N    Patient Care Team: Gretta Comer POUR, NP as PCP - General (Internal Medicine) [place], [facility]) Rollene Cough, MD as Consulting Physician (Orthopedic Surgery)  I have updated your Care Teams any recent Medical Services you may have received from other providers in the past year.     Assessment:   This is a routine wellness examination for Elta.  Hearing/Vision screen Hearing Screening - Comments:: Pt says her hearing is good except in large crowds Vision Screening - Comments:: Pt says her vision is good w/glasses Dr Mittie   Goals Addressed             This Visit's Progress    Increase physical activity       7/29/25I will continue to exercise for 60 minutes 7 days per week.      Patient Stated   On track    05/04/24-I will maintain and continue medications as prescribed.      COMPLETED: Patient Stated       02/22/2022, wants to lose some weight     Patient Stated       05/04/24-Lose weight and fit into        Depression Screen     05/04/2024    9:37 AM 05/04/2024    8:35 AM 11/04/2023    8:50 AM 05/02/2023    9:09 AM 04/30/2023   11:32 AM 11/01/2022    8:37 AM 02/22/2022    3:00 PM  PHQ 2/9 Scores  PHQ - 2 Score 0 0 0 0 0 0 0    Fall Risk     05/04/2024    8:35 AM 05/03/2024   10:04 PM 11/04/2023    8:50 AM 05/02/2023    9:09 AM 04/25/2023   10:21 AM  Fall Risk   Falls in the past year? 0 1 1 1 1   Number falls in past yr: 0 0 0 0 0  Comment     tripped on sidewalk  Injury with Fall? 0 1  1 0 1  Comment     broke kneecap  Risk for fall due to : No Fall Risks  History of fall(s) History of fall(s) Impaired vision  Follow up Falls evaluation completed Education provided;Falls prevention discussed Falls evaluation completed Falls evaluation completed Falls evaluation completed;Education provided;Falls prevention discussed    MEDICARE RISK AT HOME:  Medicare Risk at  Home Any stairs in or around the home?: (Patient-Rptd) No Home free of loose throw rugs in walkways, pet beds, electrical cords, etc?: (Patient-Rptd) Yes Adequate lighting in your home to reduce risk of falls?: (Patient-Rptd) Yes Life alert?: (Patient-Rptd) No Use of a cane, walker or w/c?: (Patient-Rptd) No Grab bars in the bathroom?: (Patient-Rptd) Yes Shower chair or bench in shower?: (Patient-Rptd) Yes Elevated toilet seat or a handicapped toilet?: (Patient-Rptd) No  TIMED UP AND GO:  Was the test performed?  No  Cognitive Function: 6CIT completed    07/26/2019    9:29 AM 06/28/2018   11:26 AM 06/23/2017    1:35 PM  MMSE - Mini Mental State Exam  Orientation to time 5 5 5    Orientation to Place 5 5 5    Registration 3 3 3    Attention/ Calculation 5 0 0   Recall 3 3 3    Language- name 2 objects  0 0   Language- repeat 1 1 1   Language- follow 3 step command  3 3   Language- read & follow direction  0 0   Write a sentence  0 0   Copy design  0 0   Total score  20 20      Data saved with a previous flowsheet row definition        05/04/2024    9:44 AM 04/30/2023   11:36 AM 02/22/2022    3:01 PM  6CIT Screen  What Year? 0 points 0 points 0 points  What month? 0 points 0 points 0 points  What time? 0 points 0 points 0 points  Count back from 20 0 points 0 points 0 points  Months in reverse 0 points 0 points 0 points  Repeat phrase 0 points 0 points 0 points  Total Score 0 points 0 points 0 points    Immunizations Immunization History  Administered Date(s) Administered   PFIZER(Purple Top)SARS-COV-2 Vaccination 10/27/2019, 11/17/2019   PNEUMOCOCCAL CONJUGATE-20 05/02/2023   Pneumococcal Conjugate-13 11/30/2014   Pneumococcal Polysaccharide-23 07/07/2013   Tdap 07/07/2013   Zoster, Live 01/01/2016    Screening Tests Health Maintenance  Topic Date Due   Diabetic kidney evaluation - Urine ACR  Never done   Diabetic kidney evaluation - eGFR measurement   05/01/2024   HEMOGLOBIN A1C  05/03/2024   DTaP/Tdap/Td (2 - Td or Tdap) 11/03/2024 (Originally 07/08/2023)   Fecal DNA (Cologuard)  11/03/2024 (Originally 11/04/2023)   INFLUENZA VACCINE  05/07/2024   FOOT EXAM  11/03/2024   OPHTHALMOLOGY EXAM  04/20/2025   Medicare Annual Wellness (AWV)  05/04/2025   Pneumococcal Vaccine: 50+ Years  Completed   DEXA SCAN  Completed   Hepatitis C Screening  Completed   Hepatitis B Vaccines  Aged Out   HPV VACCINES  Aged Out   Meningococcal B Vaccine  Aged Out   COVID-19 Vaccine  Discontinued   Zoster Vaccines- Shingrix  Discontinued    Health Maintenance  Health Maintenance Due  Topic Date Due   Diabetic kidney evaluation - Urine ACR  Never done   Diabetic kidney evaluation - eGFR measurement  05/01/2024   HEMOGLOBIN A1C  05/03/2024   Health Maintenance Items Addressed: None at this time  Additional Screening:  Vision Screening: Recommended annual ophthalmology exams for early detection of glaucoma and other disorders of the eye. Would you like a referral to an eye doctor? No    Dental Screening: Recommended annual dental exams for proper oral hygiene  Community Resource Referral / Chronic Care Management: CRR required this visit?  No   CCM required this visit?  No   Plan:    I have personally reviewed and noted the following in the patient's chart:   Medical and social history Use of alcohol, tobacco or illicit drugs  Current medications and supplements including opioid prescriptions. Patient is not currently taking opioid prescriptions. Functional ability and status Nutritional status Physical activity Advanced directives List of other physicians Hospitalizations, surgeries, and ER visits in previous 12 months Vitals Screenings to include cognitive, depression, and falls Referrals and appointments  In addition, I have reviewed and discussed with patient certain preventive protocols, quality metrics, and best practice  recommendations. A written personalized care plan for preventive services as well as general preventive health recommendations were provided to patient.   Erminio LITTIE Saris, LPN   2/70/7974   After Visit Summary: (MyChart) Due to this being a telephonic visit, the after visit summary with patients personalized plan was offered to patient via MyChart   Notes: Nothing significant to report at this time.

## 2024-05-04 NOTE — Assessment & Plan Note (Signed)
 Repeat A1c pending.  Remain off treatment. Urine microalbumin due and pending.

## 2024-05-04 NOTE — Assessment & Plan Note (Signed)
 Repeat lipid panel pending. Continue rosuvastatin 5 mg daily.

## 2024-05-04 NOTE — Progress Notes (Signed)
 Subjective:    Patient ID: Kim Holloway, female    DOB: [birth year], 78 y.o.   MRN: [id number]  HPI  Kim Holloway is a very pleasant 78 y.o. female with history of hypertension, type 2 diabetes, hyperlipidemia who presents today for complete physical and follow up of chronic conditions.  Immunizations: -Tetanus: Completed in 2014  -Shingles: Completed Zostavax -Pneumonia: Completed Prevnar 13 in 2016, Pneumovax 23 in 2024, Prevnar 20 in 2024  Diet: Fair diet.  Exercise: No regular exercise.  Eye exam: Completes annually  Dental exam: Completed > 1 year ago   Mammogram: Completed in September 2024 Bone Density Scan: Completed in September 2023  Colonoscopy: Completed Cologuard in 2021, negative. No further screening given age   BP Readings from Last 3 Encounters:  05/04/24 124/62  11/04/23 132/80  09/16/23 (!) 153/78      Review of Systems  Constitutional:  Negative for unexpected weight change.  HENT:  Negative for rhinorrhea.   Respiratory:  Negative for cough and shortness of breath.   Cardiovascular:  Negative for chest pain.  Gastrointestinal:  Negative for constipation and diarrhea.  Genitourinary:  Negative for difficulty urinating.  Musculoskeletal:  Positive for arthralgias.  Skin:  Negative for rash.  Allergic/Immunologic: Negative for environmental allergies.  Neurological:  Negative for dizziness, numbness and headaches.  Psychiatric/Behavioral:  The patient is not nervous/anxious.          Past Medical History:  Diagnosis Date   Abnormal breast biopsy 10/04/2013   right breast   Family history of adverse reaction to anesthesia 2016   brother had difficulty waking up after surgery   Finger mass, right 05/27/2017   History of colonoscopy 2008   Hypertension    Mammographic calcification 06/2017   Patellar fracture 01/30/2023   Proximal humerus fracture 03/15/2021   Left   Scalp cyst 05/01/2022   Subdural hematoma (HCC) 01/30/2023     Social History   Socioeconomic History   Marital status: Married    Spouse name: Not on file   Number of children: Not on file   Years of education: Not on file   Highest education level: GED or equivalent  Occupational History   Not on file  Tobacco Use   Smoking status: Never   Smokeless tobacco: Never  Vaping Use   Vaping status: Never Used  Substance and Sexual Activity   Alcohol use: No   Drug use: No   Sexual activity: Not on file  Other Topics Concern   Not on file  Social History Narrative   Married.   Moved from [place] . Originally from [place].   Retired.      Social Drivers of Corporate investment banker Strain: Low Risk  (04/27/2024)   Overall Financial Resource Strain (CARDIA)    Difficulty of Paying Living Expenses: Not hard at all  Food Insecurity: No Food Insecurity (04/27/2024)   Hunger Vital Sign    Worried About Running Out of Food in the Last Year: Never true    Ran Out of Food in the Last Year: Never true  Transportation Needs: No Transportation Needs (04/27/2024)   PRAPARE - Administrator, Civil Service (Medical): No    Lack of Transportation (Non-Medical): No  Physical Activity: Sufficiently Active (04/27/2024)   Exercise Vital Sign    Days of Exercise per Week: 6 days    Minutes of Exercise per Session: 60 min  Stress: No Stress Concern Present (04/27/2024)   [employer] of Occupational Health -  Occupational Stress Questionnaire    Feeling of Stress: Not at all  Social Connections: Socially Integrated (04/27/2024)   Social Connection and Isolation Panel    Frequency of Communication with Friends and Family: More than three times a week    Frequency of Social Gatherings with Friends and Family: More than three times a week    Attends Religious Services: 1 to 4 times per year    Active Member of [employer] or Organizations: Yes    Attends Banker Meetings: 1 to 4 times per year    Marital Status: Married  Careers information officer Violence: Not At Risk (04/30/2023)   Humiliation, Afraid, Rape, and Kick questionnaire    Fear of Current or Ex-Partner: No    Emotionally Abused: No    Physically Abused: No    Sexually Abused: No    Past Surgical History:  Procedure Laterality Date   BREAST BIOPSY Right 2014   stereo bx benign calcs   BREAST BIOPSY Left ?   cyst   CATARACT EXTRACTION  01/19/2016, 02/19/2016   COLONOSCOPY  2008   CYST EXCISION Right 07/31/2017   Procedure: CYST REMOVAL RIGHT INDEX FINGER;  Surgeon: Kathlynn Sharper, MD;  Location: [facility] ORS;  Service: Orthopedics;  Laterality: Right;   EYE SURGERY Left 12/2016   laser surgery    Family History  Problem Relation Age of Onset   Breast cancer Neg Hx     No Known Allergies  Current Outpatient Medications on File Prior to Visit  Medication Sig Dispense Refill   aspirin EC 81 MG tablet Take 81 mg by mouth daily.     Blood Glucose Monitoring Suppl DEVI 1 each by Does not apply route in the morning, at noon, and at bedtime. May substitute to any manufacturer covered by patient's insurance. 1 each 0   Calcium  Carb-Cholecalciferol (CALCIUM  600 + D PO) Take 2 tablets by mouth daily.     cholecalciferol (VITAMIN D) 1000 units tablet Take 1,000 Units by mouth daily.     Glucose Blood (BLOOD GLUCOSE TEST STRIPS) STRP 1 each by In Vitro route in the morning, at noon, and at bedtime. May substitute to any manufacturer covered by patient's insurance. 100 strip 5   Lancets Misc. MISC 1 each by Does not apply route in the morning, at noon, and at bedtime. May substitute to any manufacturer covered by patient's insurance. 100 each 5   No current facility-administered medications on file prior to visit.    BP 124/62   Pulse 89   Temp 97.6 F (36.4 C) (Temporal)   Ht 5' 5 (1.651 m)   Wt 166 lb (75.3 kg)   SpO2 100%   BMI 27.62 kg/m  Objective:   Physical Exam HENT:     Right Ear: Tympanic membrane and ear canal normal.     Left Ear: Tympanic  membrane and ear canal normal.  Eyes:     Pupils: Pupils are equal, round, and reactive to light.  Cardiovascular:     Rate and Rhythm: Normal rate and regular rhythm.  Pulmonary:     Effort: Pulmonary effort is normal.     Breath sounds: Normal breath sounds.  Abdominal:     General: Bowel sounds are normal.     Palpations: Abdomen is soft.     Tenderness: There is no abdominal tenderness.  Musculoskeletal:        General: Normal range of motion.     Cervical back: Neck supple.  Skin:  General: Skin is warm and dry.  Neurological:     Mental Status: She is alert and oriented to person, place, and time.     Cranial Nerves: No cranial nerve deficit.     Deep Tendon Reflexes:     Reflex Scores:      Patellar reflexes are 2+ on the right side and 2+ on the left side. Psychiatric:        Mood and Affect: Mood normal.           Assessment & Plan:  Preventative health care Assessment & Plan: Immunizations UTD. Mammogram and bone density scan due in September, orders placed. Colonoscopy UTD, no further screening given age  Discussed the importance of a healthy diet and regular exercise in order for weight loss, and to reduce the risk of further co-morbidity.  Exam stable. Labs pending.  Follow up in 1 year for repeat physical.    Essential hypertension Assessment & Plan: Controlled.  Continue irbesartan0HCTZ 150-12.5 mg daily.  CMP pending.   Orders: -     Irbesartan -hydroCHLOROthiazide ; TAKE 1 TABLET BY MOUTH ONCE DAILY FOR HIGH BLOOD  PRESSURE  Dispense: 90 tablet; Refill: 3  Hyperlipidemia associated with type 2 diabetes mellitus (HCC) Assessment & Plan: Repeat lipid panel pending.  Continue rosuvastatin  5 mg daily.  Orders: -     Lipid panel -     Comprehensive metabolic panel with GFR -     CBC  Type 2 diabetes mellitus with hyperglycemia, without long-term current use of insulin (HCC) Assessment & Plan: Repeat A1c pending.  Remain off  treatment.  Urine microalbumin due and pending.  Orders: -     Microalbumin / creatinine urine ratio -     Hemoglobin A1c  Estrogen deficiency -     DG Bone Density; Future  Screening mammogram for breast cancer -     3D Screening Mammogram, Left and Right; Future  Hyperlipidemia, unspecified hyperlipidemia type -     Rosuvastatin  Calcium ; Take 1 tablet (5 mg total) by mouth daily. for cholesterol.  Dispense: 90 tablet; Refill: 3        Comer MARLA Gaskins, NP

## 2024-05-04 NOTE — Patient Instructions (Signed)
 Stop by the lab prior to leaving today. I will notify you of your results once received.   Please schedule a follow up visit for 6 months for a diabetes check.  It was a pleasure to see you today!

## 2024-05-04 NOTE — Patient Instructions (Signed)
 Kim Holloway , Thank you for taking time out of your busy schedule to complete your Annual Wellness Visit with me. I enjoyed our conversation and look forward to speaking with you again next year. I, as well as your care team,  appreciate your ongoing commitment to your health goals. Please review the following plan we discussed and let me know if I can assist you in the future. Your Game plan/ To Do List     Follow up Visits: Next Medicare AWV with our clinical staff: 05/05/25 @ 9:30am   Have you seen your provider in the last 6 months (3 months if uncontrolled diabetes)? Yes Next Office Visit with your provider: 11/04/24  Clinician Recommendations:  Aim for 30 minutes of exercise or brisk walking, 6-8 glasses of water, and 5 servings of fruits and vegetables each day.       This is a list of the screening recommended for you and due dates:  Health Maintenance  Topic Date Due   Yearly kidney health urinalysis for diabetes  Never done   Yearly kidney function blood test for diabetes  05/01/2024   Eye exam for diabetics  04/14/2024   Hemoglobin A1C  05/03/2024   DTaP/Tdap/Td vaccine (2 - Td or Tdap) 11/03/2024*   Cologuard (Stool DNA test)  11/03/2024*   Flu Shot  05/07/2024   Complete foot exam   11/03/2024   Medicare Annual Wellness Visit  05/04/2025   Pneumococcal Vaccine for age over 22  Completed   DEXA scan (bone density measurement)  Completed   Hepatitis C Screening  Completed   Hepatitis B Vaccine  Aged Out   HPV Vaccine  Aged Out   Meningitis B Vaccine  Aged Out   COVID-19 Vaccine  Discontinued   Zoster (Shingles) Vaccine  Discontinued  *Topic was postponed. The date shown is not the original due date.    Advanced directives: (Copy Requested) Please bring a copy of your health care power of attorney and living will to the office to be added to your chart at your convenience. You can mail to [facility] [address] [place], [place] [address] or email to  [email or web address] Advance Care Planning is important because it:  [x]  Makes sure you receive the medical care that is consistent with your values, goals, and preferences  [x]  It provides guidance to your family and loved ones and reduces their decisional burden about whether or not they are making the right decisions based on your wishes.  Follow the link provided in your after visit summary or read over the paperwork we have mailed to you to help you started getting your Advance Directives in place. If you need assistance in completing these, please reach out to us  so that we can help you!

## 2024-05-04 NOTE — Assessment & Plan Note (Signed)
 Controlled.  Continue irbesartan0HCTZ 150-12.5 mg daily.  CMP pending.

## 2024-05-05 ENCOUNTER — Ambulatory Visit: Payer: Self-pay | Admitting: Primary Care

## 2024-06-23 ENCOUNTER — Ambulatory Visit
Admission: RE | Admit: 2024-06-23 | Discharge: 2024-06-23 | Disposition: A | Discharge status: Home / Self Care | Source: Ambulatory Visit | Attending: Primary Care | Admitting: Primary Care

## 2024-06-23 ENCOUNTER — Ambulatory Visit
Admission: RE | Admit: 2024-06-23 | Discharge: 2024-06-23 | Disposition: A | Discharge status: Home / Self Care | Source: Ambulatory Visit | Attending: Primary Care

## 2024-06-23 DIAGNOSIS — Z1231 Encounter for screening mammogram for malignant neoplasm of breast: Secondary | ICD-10-CM | POA: Diagnosis present

## 2024-06-23 DIAGNOSIS — E2839 Other primary ovarian failure: Secondary | ICD-10-CM | POA: Insufficient documentation

## 2024-11-04 ENCOUNTER — Ambulatory Visit: Payer: Self-pay | Admitting: Primary Care

## 2024-11-04 ENCOUNTER — Ambulatory Visit: Admitting: Primary Care

## 2024-11-04 VITALS — BP 134/80 | HR 71 | Temp 97.5°F | Ht 65.0 in | Wt 172.4 lb

## 2024-11-04 DIAGNOSIS — E1165 Type 2 diabetes mellitus with hyperglycemia: Secondary | ICD-10-CM

## 2024-11-04 LAB — POCT GLYCOSYLATED HEMOGLOBIN (HGB A1C): Hemoglobin A1C: 6.7 % — AB (ref 4.0–5.6)

## 2024-11-04 NOTE — Assessment & Plan Note (Signed)
 Controlled with A1C of 6.7 today.  Commended her on walking daily!  Foot exam today.  Follow up in 6 months.

## 2024-11-04 NOTE — Progress Notes (Signed)
 "  Subjective:    Patient ID: Kim Holloway, female    DOB: Feb 14, 1946, 79 y.o.   MRN: [id number]  Kim Holloway is a very pleasant 79 y.o. female with a history of hypertension, type 2 diabetes, hyperlipidemia who presents today for follow-up of diabetes  1) Type 2 Diabetes:  Current medications include: None  She is checking her blood glucose 0 times daily. She has a glucometer kit at home.    Last A1C: 6.8 in July 2025, 6.7 today Last Eye Exam: UTD Last Foot Exam: Due Pneumonia Vaccination: 2024 Urine Microalbumin: UTD Statin: rosuvastatin    Dietary changes since last visit: Increased appetite and intake of food in general. Eating out at restaurants   Exercise: Active at home, walks 3 miles daily with a friend.       Review of Systems  Respiratory:  Negative for shortness of breath.   Cardiovascular:  Negative for chest pain.  Neurological:  Negative for dizziness and numbness.         Past Medical History:  Diagnosis Date   Abnormal breast biopsy 10/04/2013   right breast   Arthritis 12/24   Told by orthopedic DR.   Cataract    cataract surgery both eyes   Family history of adverse reaction to anesthesia 2016   brother had difficulty waking up after surgery   Finger mass, right 05/27/2017   History of colonoscopy 2008   Hypertension    Mammographic calcification 06/2017   Patellar fracture 01/30/2023   Proximal humerus fracture 03/15/2021   Left   Scalp cyst 05/01/2022   Subdural hematoma (HCC) 01/30/2023    Social History   Socioeconomic History   Marital status: Married    Spouse name: Not on file   Number of children: Not on file   Years of education: Not on file   Highest education level: GED or equivalent  Occupational History   Not on file  Tobacco Use   Smoking status: Never   Smokeless tobacco: Never  Vaping Use   Vaping status: Never Used  Substance and Sexual Activity   Alcohol use: No   Drug use: No   Sexual activity: Not on  file  Other Topics Concern   Not on file  Social History Narrative   Married.   Moved from [place] . Originally from [place].   Retired.      Social Drivers of Health   Tobacco Use: Low Risk (05/04/2024)   Patient History    Smoking Tobacco Use: Never    Smokeless Tobacco Use: Never    Passive Exposure: Not on file  Financial Resource Strain: Low Risk (10/31/2024)   Overall Financial Resource Strain (CARDIA)    Difficulty of Paying Living Expenses: Not very hard  Food Insecurity: No Food Insecurity (10/31/2024)   Epic    Worried About Radiation Protection Practitioner of Food in the Last Year: Never true    Ran Out of Food in the Last Year: Never true  Transportation Needs: No Transportation Needs (10/31/2024)   Epic    Lack of Transportation (Medical): No    Lack of Transportation (Non-Medical): No  Physical Activity: Sufficiently Active (10/31/2024)   Exercise Vital Sign    Days of Exercise per Week: 7 days    Minutes of Exercise per Session: 60 min  Stress: No Stress Concern Present (10/31/2024)   [employer] of Occupational Health - Occupational Stress Questionnaire    Feeling of Stress: Only a little  Social Connections: Moderately Integrated (10/31/2024)   Social  Connection and Isolation Panel    Frequency of Communication with Friends and Family: More than three times a week    Frequency of Social Gatherings with Friends and Family: More than three times a week    Attends Religious Services: Patient declined    Active Member of Clubs or Organizations: Yes    Attends Banker Meetings: More than 4 times per year    Marital Status: Married  Catering Manager Violence: Not At Risk (05/04/2024)   Epic    Fear of Current or Ex-Partner: No    Emotionally Abused: No    Physically Abused: No    Sexually Abused: No  Depression (PHQ2-9): Low Risk (11/04/2024)   Depression (PHQ2-9)    PHQ-2 Score: 3  Alcohol Screen: Low Risk (05/04/2024)   Alcohol Screen    Last Alcohol Screening Score  (AUDIT): 0  Housing: Unknown (10/31/2024)   Epic    Unable to Pay for Housing in the Last Year: No    Number of Times Moved in the Last Year: Not on file    Homeless in the Last Year: No  Utilities: Not At Risk (05/04/2024)   Epic    Threatened with loss of utilities: No  Health Literacy: Adequate Health Literacy (05/04/2024)   B1300 Health Literacy    Frequency of need for help with medical instructions: Never    Past Surgical History:  Procedure Laterality Date   BREAST BIOPSY Right 2014   stereo bx benign calcs   BREAST BIOPSY Left ?   cyst   CATARACT EXTRACTION  01/19/2016, 02/19/2016   COLONOSCOPY  2008   CYST EXCISION Right 07/31/2017   Procedure: CYST REMOVAL RIGHT INDEX FINGER;  Surgeon: Kathlynn Sharper, MD;  Location: [facility] ORS;  Service: Orthopedics;  Laterality: Right;   EYE SURGERY Left 12/2016   laser surgery    Family History  Problem Relation Age of Onset   Heart disease Mother    Cancer Brother    Hypertension Brother    Diabetes Brother    Breast cancer Neg Hx     Allergies[1]  Medications Ordered Prior to Encounter[2]  BP 134/80   Pulse 71   Temp (!) 97.5 F (36.4 C) (Oral)   Ht 5' 5 (1.651 m)   Wt 172 lb 6.4 oz (78.2 kg)   SpO2 93%   BMI 28.69 kg/m  Objective:   Physical Exam Cardiovascular:     Rate and Rhythm: Normal rate and regular rhythm.  Pulmonary:     Effort: Pulmonary effort is normal.     Breath sounds: Normal breath sounds.  Musculoskeletal:     Cervical back: Neck supple.  Skin:    General: Skin is warm and dry.  Neurological:     Mental Status: She is alert and oriented to person, place, and time.  Psychiatric:        Mood and Affect: Mood normal.     Physical Exam        Assessment & Plan:  Type 2 diabetes mellitus with hyperglycemia, without long-term current use of insulin (HCC) Assessment & Plan: Controlled with A1C of 6.7 today.  Commended her on walking daily!  Foot exam today.  Follow up in 6 months.    Orders: -     POCT glycosylated hemoglobin (Hb A1C)    Assessment and Plan Assessment & Plan         Comer MARLA Gaskins, NP       [1] No Known Allergies [2]  Current Outpatient Medications on File Prior to Visit  Medication Sig Dispense Refill   aspirin EC 81 MG tablet Take 81 mg by mouth daily.     Calcium  Carb-Cholecalciferol (CALCIUM  600 + D PO) Take 2 tablets by mouth daily.     cholecalciferol (VITAMIN D) 1000 units tablet Take 1,000 Units by mouth daily.     irbesartan -hydrochlorothiazide  (AVALIDE) 150-12.5 MG tablet TAKE 1 TABLET BY MOUTH ONCE DAILY FOR HIGH BLOOD  PRESSURE 90 tablet 3   rosuvastatin  (CRESTOR ) 5 MG tablet Take 1 tablet (5 mg total) by mouth daily. for cholesterol. 90 tablet 3   No current facility-administered medications on file prior to visit.   "

## 2024-11-04 NOTE — Patient Instructions (Signed)
 Please schedule a physical to meet with me in 6 months.   It was a pleasure to see you today!

## 2025-05-05 ENCOUNTER — Encounter: Admitting: Primary Care

## 2025-05-05 ENCOUNTER — Ambulatory Visit
